# Patient Record
Sex: Female | Born: 1970 | Race: Black or African American | Hispanic: No | Marital: Single | Smoking: Never smoker
Health system: Southern US, Community
[De-identification: ages and names within clinical notes are randomized; demographics above are authoritative.]

## PROBLEM LIST (undated history)

## (undated) DIAGNOSIS — I10 Essential (primary) hypertension: Secondary | ICD-10-CM

## (undated) HISTORY — PX: ABDOMINAL HYSTERECTOMY: SHX81

---

## 2006-09-15 ENCOUNTER — Emergency Department: Payer: Self-pay | Admitting: Emergency Medicine

## 2008-06-22 ENCOUNTER — Emergency Department: Payer: Self-pay | Admitting: Emergency Medicine

## 2009-10-18 ENCOUNTER — Ambulatory Visit: Payer: Self-pay | Admitting: Obstetrics & Gynecology

## 2009-10-22 ENCOUNTER — Ambulatory Visit: Payer: Self-pay | Admitting: Obstetrics & Gynecology

## 2010-05-23 ENCOUNTER — Emergency Department: Payer: Self-pay | Admitting: Emergency Medicine

## 2012-09-11 ENCOUNTER — Emergency Department: Payer: Self-pay | Admitting: Emergency Medicine

## 2013-10-27 ENCOUNTER — Emergency Department: Payer: Self-pay | Admitting: Emergency Medicine

## 2013-11-14 ENCOUNTER — Emergency Department: Payer: Self-pay | Admitting: Emergency Medicine

## 2014-01-09 ENCOUNTER — Emergency Department: Payer: Self-pay | Admitting: Emergency Medicine

## 2015-01-12 ENCOUNTER — Emergency Department
Admit: 2015-01-12 | Disposition: A | Discharge status: Home / Self Care | Payer: Self-pay | Admitting: Emergency Medicine

## 2015-04-13 ENCOUNTER — Other Ambulatory Visit: Payer: Self-pay | Admitting: Internal Medicine

## 2015-04-13 DIAGNOSIS — Z1231 Encounter for screening mammogram for malignant neoplasm of breast: Secondary | ICD-10-CM

## 2015-04-19 ENCOUNTER — Ambulatory Visit
Admission: RE | Admit: 2015-04-19 | Discharge: 2015-04-19 | Disposition: A | Discharge status: Home / Self Care | Payer: PRIVATE HEALTH INSURANCE | Source: Ambulatory Visit | Attending: Internal Medicine | Admitting: Internal Medicine

## 2015-04-19 DIAGNOSIS — Z1231 Encounter for screening mammogram for malignant neoplasm of breast: Secondary | ICD-10-CM | POA: Insufficient documentation

## 2015-04-19 DIAGNOSIS — R928 Other abnormal and inconclusive findings on diagnostic imaging of breast: Secondary | ICD-10-CM | POA: Insufficient documentation

## 2015-04-20 ENCOUNTER — Other Ambulatory Visit: Payer: Self-pay

## 2015-04-23 ENCOUNTER — Other Ambulatory Visit: Payer: Self-pay | Admitting: Internal Medicine

## 2015-04-23 ENCOUNTER — Ambulatory Visit: Admit: 2015-04-23 | Payer: Self-pay

## 2015-04-23 DIAGNOSIS — N6489 Other specified disorders of breast: Secondary | ICD-10-CM

## 2015-04-23 DIAGNOSIS — R928 Other abnormal and inconclusive findings on diagnostic imaging of breast: Secondary | ICD-10-CM

## 2015-04-23 SURGERY — CORRECTION, HALLUX VALGUS
Anesthesia: Choice | Laterality: Right

## 2015-04-28 ENCOUNTER — Ambulatory Visit
Admission: RE | Admit: 2015-04-28 | Discharge: 2015-04-28 | Disposition: A | Discharge status: Home / Self Care | Payer: PRIVATE HEALTH INSURANCE | Source: Ambulatory Visit | Attending: Internal Medicine | Admitting: Internal Medicine

## 2015-04-28 DIAGNOSIS — N6489 Other specified disorders of breast: Secondary | ICD-10-CM

## 2015-04-28 DIAGNOSIS — R928 Other abnormal and inconclusive findings on diagnostic imaging of breast: Secondary | ICD-10-CM | POA: Insufficient documentation

## 2015-05-04 ENCOUNTER — Other Ambulatory Visit: Payer: Self-pay | Admitting: Internal Medicine

## 2015-05-04 DIAGNOSIS — N63 Unspecified lump in unspecified breast: Secondary | ICD-10-CM

## 2015-05-04 DIAGNOSIS — N6489 Other specified disorders of breast: Secondary | ICD-10-CM

## 2015-05-10 ENCOUNTER — Ambulatory Visit
Admission: RE | Admit: 2015-05-10 | Discharge: 2015-05-10 | Disposition: A | Discharge status: Home / Self Care | Payer: PRIVATE HEALTH INSURANCE | Source: Ambulatory Visit | Attending: Internal Medicine | Admitting: Internal Medicine

## 2015-05-10 DIAGNOSIS — N63 Unspecified lump in unspecified breast: Secondary | ICD-10-CM

## 2015-05-10 DIAGNOSIS — N6489 Other specified disorders of breast: Secondary | ICD-10-CM

## 2015-05-10 DIAGNOSIS — D249 Benign neoplasm of unspecified breast: Secondary | ICD-10-CM | POA: Insufficient documentation

## 2015-05-11 HISTORY — PX: BREAST BIOPSY: SHX20

## 2015-05-11 LAB — SURGICAL PATHOLOGY

## 2015-06-07 ENCOUNTER — Encounter: Payer: Self-pay | Admitting: *Deleted

## 2015-06-07 ENCOUNTER — Inpatient Hospital Stay: Admission: RE | Admit: 2015-06-07 | Payer: PRIVATE HEALTH INSURANCE | Source: Ambulatory Visit

## 2015-06-07 NOTE — Patient Instructions (Signed)
  Your procedure is scheduled on: 06-11-15 Report to [place] To find out your arrival time please call [phone] between 1PM - 3PM on 06-10-15  Remember: Instructions that are not followed completely may result in serious medical risk, up to and including death, or upon the discretion of your surgeon and anesthesiologist your surgery may need to be rescheduled.    _X___ 1. Do not eat food or drink liquids after midnight. No gum chewing or hard candies.     _X___ 2. No Alcohol for 24 hours before or after surgery.   ____ 3. Bring all medications with you on the day of surgery if instructed.    ____ 4. Notify your doctor if there is any change in your medical condition     (cold, fever, infections).     Do not wear jewelry, make-up, hairpins, clips or nail polish.  Do not wear lotions, powders, or perfumes. You may wear deodorant.  Do not shave 48 hours prior to surgery. Men may shave face and neck.  Do not bring valuables to the hospital.    [facility] is not responsible for any belongings or valuables.               Contacts, dentures or bridgework may not be worn into surgery.  Leave your suitcase in the car. After surgery it may be brought to your room.  For patients admitted to the hospital, discharge time is determined by your  treatment team.   Patients discharged the day of surgery will not be allowed to drive home.   Please read over the following fact sheets that you were given:      _X___ Take these medicines the morning of surgery with A SIP OF WATER:    1. LISINOPRIL  2.   3.   4.  5.  6.  ____ Fleet Enema (as directed)   ____ Use CHG Soap as directed  ____ Use inhalers on the day of surgery  ____ Stop metformin 2 days prior to surgery    ____ Take 1/2 of usual insulin dose the night before surgery and none on the morning of surgery.   ____ Stop Coumadin/Plavix/aspirin-N/A  ____ Stop Anti-inflammatories-NO NSAIDS OR ASA  PRODUCTS-TYLENOL OK   ____ Stop supplements until after surgery.    ____ Bring C-Pap to the hospital.

## 2015-06-11 ENCOUNTER — Encounter
Admission: RE | Disposition: A | Discharge status: Home / Self Care | Payer: Self-pay | Source: Ambulatory Visit | Attending: Podiatry

## 2015-06-11 ENCOUNTER — Ambulatory Visit: Payer: PRIVATE HEALTH INSURANCE | Admitting: Anesthesiology

## 2015-06-11 ENCOUNTER — Ambulatory Visit
Admission: RE | Admit: 2015-06-11 | Discharge: 2015-06-11 | Disposition: A | Discharge status: Home / Self Care | Payer: PRIVATE HEALTH INSURANCE | Source: Ambulatory Visit | Attending: Podiatry | Admitting: Podiatry

## 2015-06-11 DIAGNOSIS — M2011 Hallux valgus (acquired), right foot: Secondary | ICD-10-CM | POA: Diagnosis present

## 2015-06-11 DIAGNOSIS — Z90710 Acquired absence of both cervix and uterus: Secondary | ICD-10-CM | POA: Insufficient documentation

## 2015-06-11 DIAGNOSIS — G8929 Other chronic pain: Secondary | ICD-10-CM | POA: Insufficient documentation

## 2015-06-11 DIAGNOSIS — I10 Essential (primary) hypertension: Secondary | ICD-10-CM | POA: Insufficient documentation

## 2015-06-11 DIAGNOSIS — M79671 Pain in right foot: Secondary | ICD-10-CM | POA: Insufficient documentation

## 2015-06-11 DIAGNOSIS — Z79899 Other long term (current) drug therapy: Secondary | ICD-10-CM | POA: Diagnosis not present

## 2015-06-11 HISTORY — PX: HALLUX VALGUS AUSTIN: SHX6623

## 2015-06-11 HISTORY — DX: Essential (primary) hypertension: I10

## 2015-06-11 LAB — CBC
HEMATOCRIT: 38.6 % (ref 35.0–47.0)
Hemoglobin: 12.9 g/dL (ref 12.0–16.0)
MCH: 31.2 pg (ref 26.0–34.0)
MCHC: 33.3 g/dL (ref 32.0–36.0)
MCV: 93.6 fL (ref 80.0–100.0)
Platelets: 226 10*3/uL (ref 150–440)
RBC: 4.12 MIL/uL (ref 3.80–5.20)
RDW: 12.7 % (ref 11.5–14.5)
WBC: 5.9 10*3/uL (ref 3.6–11.0)

## 2015-06-11 SURGERY — CORRECTION, HALLUX VALGUS
Anesthesia: General | Laterality: Right

## 2015-06-11 MED ORDER — PROPOFOL INFUSION 10 MG/ML OPTIME
INTRAVENOUS | Status: DC | PRN
  Administered 2015-06-11: 30 ug/kg/min via INTRAVENOUS

## 2015-06-11 MED ORDER — LACTATED RINGERS IV SOLN
INTRAVENOUS | Status: DC
  Administered 2015-06-11 (×2): via INTRAVENOUS

## 2015-06-11 MED ORDER — HYDROCODONE-ACETAMINOPHEN 5-325 MG PO TABS: 1.0000 | ORAL_TABLET | ORAL | Status: DC | PRN

## 2015-06-11 MED ORDER — FAMOTIDINE 20 MG PO TABS
20.0000 mg | ORAL_TABLET | Freq: Once | ORAL | Status: AC
  Administered 2015-06-11: 20 mg via ORAL

## 2015-06-11 MED ORDER — FENTANYL CITRATE (PF) 100 MCG/2ML IJ SOLN: 25.0000 ug | INTRAMUSCULAR | Status: DC | PRN

## 2015-06-11 MED ORDER — FENTANYL CITRATE (PF) 100 MCG/2ML IJ SOLN
INTRAMUSCULAR | Status: DC | PRN
  Administered 2015-06-11: 50 ug via INTRAVENOUS
  Administered 2015-06-11 (×2): 25 ug via INTRAVENOUS

## 2015-06-11 MED ORDER — BUPIVACAINE HCL (PF) 0.5 % IJ SOLN
INTRAMUSCULAR | Status: DC | PRN
  Administered 2015-06-11: 10 mL

## 2015-06-11 MED ORDER — LACTATED RINGERS IV SOLN: INTRAVENOUS | Status: DC

## 2015-06-11 MED ORDER — LIDOCAINE HCL (PF) 1 % IJ SOLN
INTRAMUSCULAR | Status: AC
  Filled 2015-06-11: qty 30

## 2015-06-11 MED ORDER — BUPIVACAINE HCL (PF) 0.5 % IJ SOLN
INTRAMUSCULAR | Status: AC
  Filled 2015-06-11: qty 30

## 2015-06-11 MED ORDER — FAMOTIDINE 20 MG PO TABS
ORAL_TABLET | ORAL | Status: AC
  Administered 2015-06-11: 20 mg via ORAL
  Filled 2015-06-11: qty 1

## 2015-06-11 MED ORDER — MIDAZOLAM HCL 2 MG/2ML IJ SOLN
INTRAMUSCULAR | Status: DC | PRN
  Administered 2015-06-11: 2 mg via INTRAVENOUS

## 2015-06-11 MED ORDER — ONDANSETRON HCL 4 MG/2ML IJ SOLN: 4.0000 mg | Freq: Once | INTRAMUSCULAR | Status: DC | PRN

## 2015-06-11 MED ORDER — CEFAZOLIN SODIUM 1-5 GM-% IV SOLN
INTRAVENOUS | Status: AC
  Filled 2015-06-11: qty 50

## 2015-06-11 MED ORDER — CEFAZOLIN SODIUM 1-5 GM-% IV SOLN
1.0000 g | Freq: Once | INTRAVENOUS | Status: AC
  Administered 2015-06-11: 1 g via INTRAVENOUS

## 2015-06-11 MED ORDER — NEOMYCIN-POLYMYXIN B GU 40-200000 IR SOLN
Status: DC | PRN
  Administered 2015-06-11: 2 mL

## 2015-06-11 MED ORDER — NEOMYCIN-POLYMYXIN B GU 40-200000 IR SOLN
Status: AC
  Filled 2015-06-11: qty 2

## 2015-06-11 SURGICAL SUPPLY — 61 items
BAG COUNTER SPONGE EZ (MISCELLANEOUS) IMPLANT
BANDAGE ELASTIC 4 CLIP NS LF (GAUZE/BANDAGES/DRESSINGS) ×3 IMPLANT
BANDAGE STRETCH 3X4.1 STRL (GAUZE/BANDAGES/DRESSINGS) IMPLANT
BIT DRILL CANN 3.0 (BIT) ×3 IMPLANT
BIT DRILL TWST CANN 2.2X1.87MM (DRILL) ×1 IMPLANT
BIT DRILL TWST COUNTRSNK 122IN (MISCELLANEOUS) ×1 IMPLANT
BLADE EAR TYMPAN 2.5 STR BEAV (BLADE) ×3 IMPLANT
BLADE MED AGGRESSIVE (BLADE) ×3 IMPLANT
BLADE OSC/SAGITTAL MD 5.5X18 (BLADE) IMPLANT
BLADE SURG 15 STRL LF DISP TIS (BLADE) ×2 IMPLANT
BLADE SURG 15 STRL SS (BLADE) ×4
BNDG ESMARK 4X12 TAN STRL LF (GAUZE/BANDAGES/DRESSINGS) ×3 IMPLANT
CANISTER SUCT 1200ML W/VALVE (MISCELLANEOUS) ×3 IMPLANT
CLOSURE WOUND 1/4X4 (GAUZE/BANDAGES/DRESSINGS) ×1
COUNTER SPONGE BAG EZ (MISCELLANEOUS)
COUNTERSINK IMPLANT
CUFF TOURN 18 STER (MISCELLANEOUS) IMPLANT
CUFF TOURN DUAL PL 12 NO SLV (MISCELLANEOUS) ×3 IMPLANT
DRAPE FLUOR MINI C-ARM 54X84 (DRAPES) ×3 IMPLANT
DRILL BIT IMPLANT
DRILL TWIST CANN 2.2X1.87MM (DRILL) ×3
DRILL TWIST COUNTERSINK 122IN (MISCELLANEOUS) ×3
DRSG TELFA 3X8 NADH (GAUZE/BANDAGES/DRESSINGS) IMPLANT
DURAPREP 26ML APPLICATOR (WOUND CARE) ×3 IMPLANT
GAUZE PETRO XEROFOAM 1X8 (MISCELLANEOUS) ×3 IMPLANT
GAUZE SPONGE 4X4 12PLY STRL (GAUZE/BANDAGES/DRESSINGS) ×3 IMPLANT
GAUZE STRETCH 2X75IN STRL (MISCELLANEOUS) ×3 IMPLANT
GLOVE BIO SURGEON STRL SZ7.5 (GLOVE) ×9 IMPLANT
GLOVE INDICATOR 8.0 STRL GRN (GLOVE) ×3 IMPLANT
GOWN STRL REUS W/ TWL LRG LVL3 (GOWN DISPOSABLE) ×2 IMPLANT
GOWN STRL REUS W/TWL LRG LVL3 (GOWN DISPOSABLE) ×4
K-WIRE IMPLANT
K-WIRE 1.1 (WIRE) ×2
K-WIRE FX100X1.1XTROC TIP (WIRE) ×1
K-WIRE TROCAR .8X100 (WIRE) ×6 IMPLANT
KWIRE FX100X1.1XTROC TIP (WIRE) ×1 IMPLANT
LABEL OR SOLS (LABEL) ×3 IMPLANT
NEEDLE FILTER BLUNT 18X 1/2SAF (NEEDLE) ×4
NEEDLE FILTER BLUNT 18X1 1/2 (NEEDLE) ×2 IMPLANT
NEEDLE HYPO 25X1 1.5 SAFETY (NEEDLE) ×3 IMPLANT
NS IRRIG 500ML POUR BTL (IV SOLUTION) ×3 IMPLANT
PACK EXTREMITY ARMC (MISCELLANEOUS) ×3 IMPLANT
PAD CAST CTTN 4X4 STRL (SOFTGOODS) ×3 IMPLANT
PAD GROUND ADULT SPLIT (MISCELLANEOUS) ×3 IMPLANT
PADDING CAST COTTON 4X4 STRL (SOFTGOODS) ×6
PENCIL ELECTRO HAND CTR (MISCELLANEOUS) ×3 IMPLANT
RASP SM TEAR CROSS CUT (RASP) ×3 IMPLANT
SCREW CANN 2.2X23 SH THR (Screw) ×3 IMPLANT
SOL PREP PVP 2OZ (MISCELLANEOUS) ×3
SOLUTION PREP PVP 2OZ (MISCELLANEOUS) ×1 IMPLANT
SPLINT FAST PLASTER 5X30 (CAST SUPPLIES) ×2
SPLINT PLASTER CAST FAST 5X30 (CAST SUPPLIES) ×1 IMPLANT
STOCKINETTE STRL 6IN 960660 (GAUZE/BANDAGES/DRESSINGS) ×3 IMPLANT
STRAP SAFETY BODY (MISCELLANEOUS) ×3 IMPLANT
STRIP CLOSURE SKIN 1/4X4 (GAUZE/BANDAGES/DRESSINGS) ×2 IMPLANT
SUT VIC AB 4-0 FS2 27 (SUTURE) ×3 IMPLANT
SWABSTK COMLB BENZOIN TINCTURE (MISCELLANEOUS) ×3 IMPLANT
SYR 5ML LL (SYRINGE) ×3 IMPLANT
SYRINGE 10CC LL (SYRINGE) IMPLANT
WIRE Z .045 C-WIRE SPADE TIP (WIRE) ×3 IMPLANT
WIRE Z .062 C-WIRE SPADE TIP (WIRE) IMPLANT

## 2015-06-11 NOTE — Transfer of Care (Signed)
Immediate Anesthesia Transfer of Care Note  Patient: Amy Bullock  Procedure(s) Performed: Procedure(s): HALLUX VALGUS [place] (Right)  Patient Location: PACU  Anesthesia Type:General  Level of Consciousness: awake, alert  and oriented  Airway & Oxygen Therapy: Patient Spontanous Breathing and Patient connected to nasal cannula oxygen  Post-op Assessment: Report given to RN and Post -op Vital signs reviewed and stable  Post vital signs: Reviewed and stable  Last Vitals:  Filed Vitals:   06/11/15 0608  BP: 165/121  Pulse: 70  Temp: 36.6 C  Resp: 16    Complications: No apparent anesthesia complications

## 2015-06-11 NOTE — Op Note (Signed)
Date of operation: 09/16 2016.  Surgeon: Durward Fortes DPM.  Preoperative diagnosis: Hallux valgus deformity right foot.  Postoperative diagnosis: Same.  Procedure: Hallux valgus correction right foot [place] osteotomy.  Anesthesia: Local Mac.  Hemostasis: Pneumatic tourniquet right ankle 250 mmHg.  Estimated blood loss: 5 cc.  Pathology: None.  Materials: 2.2 mm x 23 mm length Medartis screw.  Complications: None apparent.  Operative indications: This is a 44 year old female with a history of a painful bunion/hallux valgus deformity on her right foot. She has been through conservative care and elects for surgical correction of her deformity.  Operative procedure: The patient was taken to the operating room and placed on the table in the supine position. Following satisfactory sedation the right foot was anesthetized with 10 cc of 0.5% bupivacaine plain around the right first metatarsal. A pneumatic tourniquet was applied at the level of the right ankle and the foot was prepped and draped in the usual sterile fashion. The foot was exsanguinated and the tourniquet inflated to 250 mmHg.   Attention was then directed to the dorsal aspect of the right foot where an approximate 5 cm linear incision was made coursing proximal to distal centered over the first metatarsal and metatarsophalangeal joint. The incision was deepened via sharp and blunt dissection with care taken to coagulate all bleeders and retract neurovascular structures. At the level of the joint a linear capsulotomy was performed and the capsular and periosteal tissues reflected off the medial and dorsal head of the first metatarsal. There was a prominent medial eminence with some degenerative changes in the sagittal groove. Using a bone saw the medial eminence was was resected in toto. A 0.045 inch K wire was then driven from medial to lateral as an axis guide and a V-shaped osteotomy was performed through the head of the first  metatarsal. The pin was removed and the capital fragment was freely mobilized.   Attention was then directed to the lateral aspect of the joint where the incision was deepened down into the first intermetatarsal space. The intermetatarsal ligament was incised and the abductor tendon was freed from the lateral aspect of the sesamoids. A fibular sesamoid release was also performed. There was noted to be good release of the contracture on the great toe. Attention was then directed back medially where the capital fragment was mobilized laterally and fixated using a pin from the Medartis screw set. Intraoperative FluoroScan views revealed good reduction of the deformity. A 2.2 mm Medartis screw, 23 mm length was then inserted over the wire in standard fashion and there was excellent compression at the osteotomy right. The pin was removed. The medial eminence was resected and the edges were rasped smooth. Intraoperative FluoroScan views revealed good alignment of the first ray with screw placement. The wound was then flushed with copious amounts of sterile saline and closed in layers using 40 vital running suture for all layers from capsular and periosteal tissue to deep and super superficial subcutaneous and followed by skin closure. Tincture of benzoin and Steri-Strips applied to the incision followed by Xeroform and a sterile bandage. The tourniquet was released and blood flow noted to return immediately to the right foot and all digits. A posterior plaster splint was then applied to the right foot and ankle with the foot 90 relative to the leg. Patient tolerated the procedure and anesthesia well and was transported to the PACU with vital signs stable and in good condition.

## 2015-06-11 NOTE — H&P (Signed)
  History and physical in the chart was reviewed. No significant changes. Patient stable for surgery.

## 2015-06-11 NOTE — Anesthesia Preprocedure Evaluation (Signed)
Anesthesia Evaluation  Patient identified by MRN, date of birth, ID band Patient awake    Reviewed: Allergy & Precautions, NPO status , Patient's Chart, lab work & pertinent test results  History of Anesthesia Complications Negative for: history of anesthetic complications  Airway Mallampati: II  TM Distance: >3 FB Neck ROM: Full    Dental  (+) Teeth Intact   Pulmonary neg pulmonary ROS,           Cardiovascular hypertension, Pt. on medications      Neuro/Psych    GI/Hepatic negative GI ROS, Neg liver ROS,   Endo/Other  negative endocrine ROS  Renal/GU negative Renal ROS     Musculoskeletal   Abdominal   Peds  Hematology negative hematology ROS (+)   Anesthesia Other Findings   Reproductive/Obstetrics                             Anesthesia Physical Anesthesia Plan  ASA: II  Anesthesia Plan: General   Post-op Pain Management:    Induction: Intravenous  Airway Management Planned: Nasal Cannula  Additional Equipment:   Intra-op Plan:   Post-operative Plan:   Informed Consent: I have reviewed the patients History and Physical, chart, labs and discussed the procedure including the risks, benefits and alternatives for the proposed anesthesia with the patient or authorized representative who has indicated his/her understanding and acceptance.     Plan Discussed with:   Anesthesia Plan Comments:         Anesthesia Quick Evaluation

## 2015-06-11 NOTE — Interval H&P Note (Signed)
History and Physical Interval Note:  06/11/2015 7:13 AM  Amy Bullock  has presented today for surgery, with the diagnosis of RIGHT HALLUX VALGUS  The various methods of treatment have been discussed with the patient and family. After consideration of risks, benefits and other options for treatment, the patient has consented to  Procedure(s): [place] (Right) as a surgical intervention .  The patient's history has been reviewed, patient examined, no change in status, stable for surgery.  I have reviewed the patient's chart and labs.  Questions were answered to the patient's satisfaction.     [name]

## 2015-06-11 NOTE — Discharge Instructions (Addendum)
1. Elevate the right lower extremity.  2. Keep the bandage clean, dry, and do not remove.  3. Sponge bathe only the right lower leg.  4. Wear surgical shoes whenever walking or standing on the right foot.  5. Take one pain pill, Norco, every 4 hours as needed for painAMBULATORY SURGERY  DISCHARGE INSTRUCTIONS   1) The drugs that you were given will stay in your system until tomorrow so for the next 24 hours you should not:  A) Drive an automobile B) Make any legal decisions C) Drink any alcoholic beverage   2) You may resume regular meals tomorrow.  Today it is better to start with liquids and gradually work up to solid foods.  You may eat anything you prefer, but it is better to start with liquids, then soup and crackers, and gradually work up to solid foods.   3) Please notify your doctor immediately if you have any unusual bleeding, trouble breathing, redness and pain at the surgery site, drainage, fever, or pain not relieved by medication.    4) Additional Instructions:        Please contact your physician with any problems or Same Day Surgery at [phone], Monday through Friday 6 am to 4 pm, or [place] at [facility] number at [phone].

## 2015-06-11 NOTE — Anesthesia Postprocedure Evaluation (Signed)
  Anesthesia Post-op Note  Patient: Amy Bullock  Procedure(s) Performed: Procedure(s): HALLUX VALGUS AUSTIN (Right)  Anesthesia type:General  Patient location: PACU  Post pain: Pain level controlled  Post assessment: Post-op Vital signs reviewed, Patient's Cardiovascular Status Stable, Respiratory Function Stable, Patent Airway and No signs of Nausea or vomiting  Post vital signs: Reviewed and stable  Last Vitals:  Filed Vitals:   06/11/15 1011  BP: 184/97  Pulse: 8  Temp:   Resp:     Level of consciousness: awake, alert  and patient cooperative  Complications: No apparent anesthesia complications

## 2015-11-11 ENCOUNTER — Other Ambulatory Visit: Payer: Self-pay | Admitting: Internal Medicine

## 2015-12-02 ENCOUNTER — Other Ambulatory Visit: Payer: Self-pay | Admitting: Internal Medicine

## 2015-12-02 DIAGNOSIS — R928 Other abnormal and inconclusive findings on diagnostic imaging of breast: Secondary | ICD-10-CM

## 2015-12-30 ENCOUNTER — Ambulatory Visit
Admission: RE | Admit: 2015-12-30 | Discharge: 2015-12-30 | Disposition: A | Discharge status: Home / Self Care | Payer: PRIVATE HEALTH INSURANCE | Source: Ambulatory Visit | Attending: Internal Medicine | Admitting: Internal Medicine

## 2015-12-30 DIAGNOSIS — R928 Other abnormal and inconclusive findings on diagnostic imaging of breast: Secondary | ICD-10-CM | POA: Insufficient documentation

## 2016-07-26 ENCOUNTER — Other Ambulatory Visit: Payer: Self-pay | Admitting: Internal Medicine

## 2017-04-23 IMAGING — US US BREAST LTD UNI LEFT INC AXILLA
1 series · 1 of 1 positions shown · non-contrast
Comparison: Baseline screening mammogram dated 04/19/2015

CLINICAL DATA: 44-year-old female, callback from screening
mammogram for possible left breast distortion.

EXAM:
DIGITAL DIAGNOSTIC LEFT MAMMOGRAM WITH 3D TOMOSYNTHESIS WITH CAD
ULTRASOUND LEFT BREAST

[Series 1: us breast ltd uni left inc axilla · 0.08mm/px · 1 of 1 slices shown]
[im 1/1]
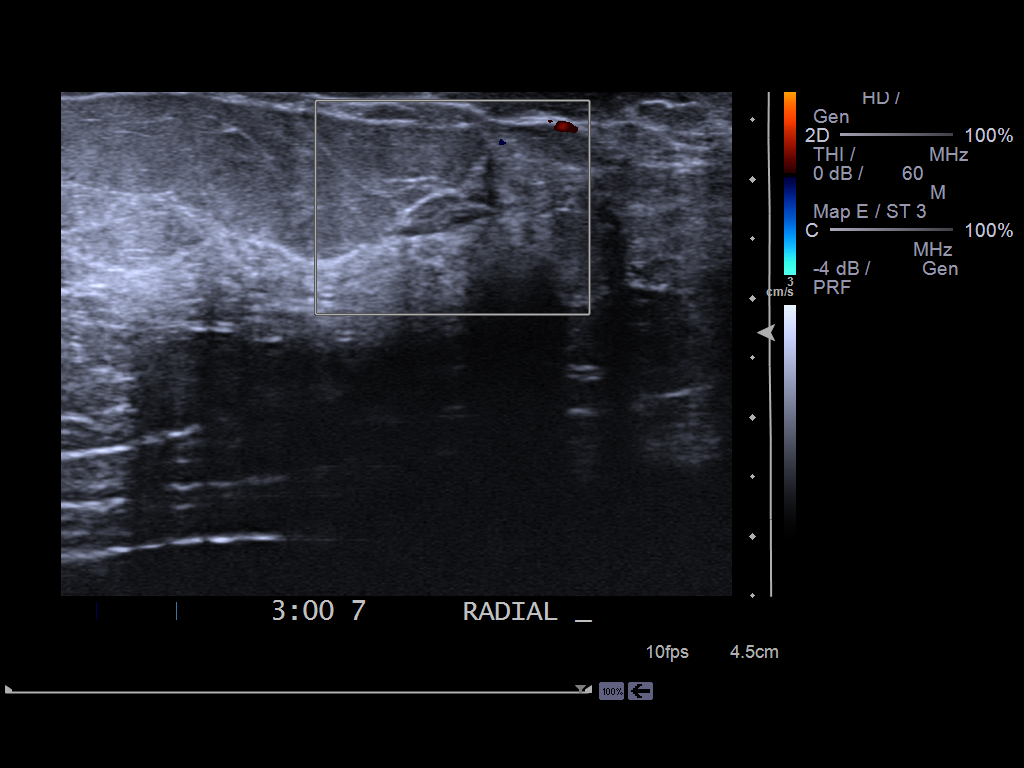

[1 of 1 positions shown; findings below may reference images not displayed]

ACR Breast Density Category c: The breast tissue is heterogeneously
dense, which may obscure small masses.
FINDINGS: CC, MLO, and CC spot compression views of the left breast with
tomosynthesis were performed. On the additional views, the possible
area of distortion identified within the superior left breast does
not persist. A possible asymmetry was seen within the lateral left
breast on the CC tomosynthesis images. This finding appeared to be
within the inferior left breast on the tomosynthesis images. No
correlate was identified on the MLO view.

Mammographic images were processed with CAD.

On physical exam, no discrete mass is felt in the area of concern
within the lower, outer left breast.

Targeted ultrasound of the lower, outer left breast was performed
demonstrating an oval, hypoechoic mass at 3 o'clock, 7 cm from the
nipple measuring 10 x 4 x 8 mm. This finding is thought likely
benign and may represent area of fibrocystic change versus a
fibroadenoma. This finding is thought likely incidental. No definite
correlate for the possible asymmetry identified mammographically is
identified.
IMPRESSION: Probably benign left breast findings.

RECOMMENDATION:
Options including short-term follow-up versus ultrasound-guided
biopsy were discussed with the patient. The patient wishes to pursue
ultrasound-guided biopsy. As this finding is thought likely
incidental and unrelated to the possible asymmetry identified within
the lateral left breast, a six-month follow-up diagnostic mammogram
and possible ultrasound is recommended if biopsy results are benign.

I have discussed the findings and recommendations with the patient.
Results were also provided in writing at the conclusion of the
visit. If applicable, a reminder letter will be sent to the patient
regarding the next appointment.

BI-RADS CATEGORY  3: Probably benign.

## 2017-05-05 IMAGING — US US BREAST BX W LOC DEV 1ST LESION IMG BX SPEC US GUIDE*L*
1 series · 8 of 8 positions shown · non-contrast
Comparison: Previous exam(s).

ADDENDUM:
Pathology of the left breast biopsy revealed FIBROADENOMA. NEGATIVE
FOR ATYPIA AND MALIGNANCY. This was found to be concordant by Dr.
Naveen Shabazz.

At the patient's request, pathology and recommendations were relayed
to the patient by phone. She stated she has done well following the
biopsy with no bleeding, bruising, or hematoma at the biopsy site.
Post biopsy instructions were reviewed with the patient. She was
encouraged to call the [REDACTED]
Recommendations: Recommend a six month follow-up mammogram and
possible ultrasound for additional asymmetry of the left breast.
Pathology and recommendations relayed by Odowd, Aravind on
05/11/15.
CLINICAL DATA: Left breast mass 3 o'clock position.
EXAM:
ULTRASOUND GUIDED LEFT BREAST CORE NEEDLE BIOPSY

[Series 1: us breast bx w loc dev 1st lesion img bx spec us g · 0.08mm/px · 8 of 8 slices shown]
[im 1/8]
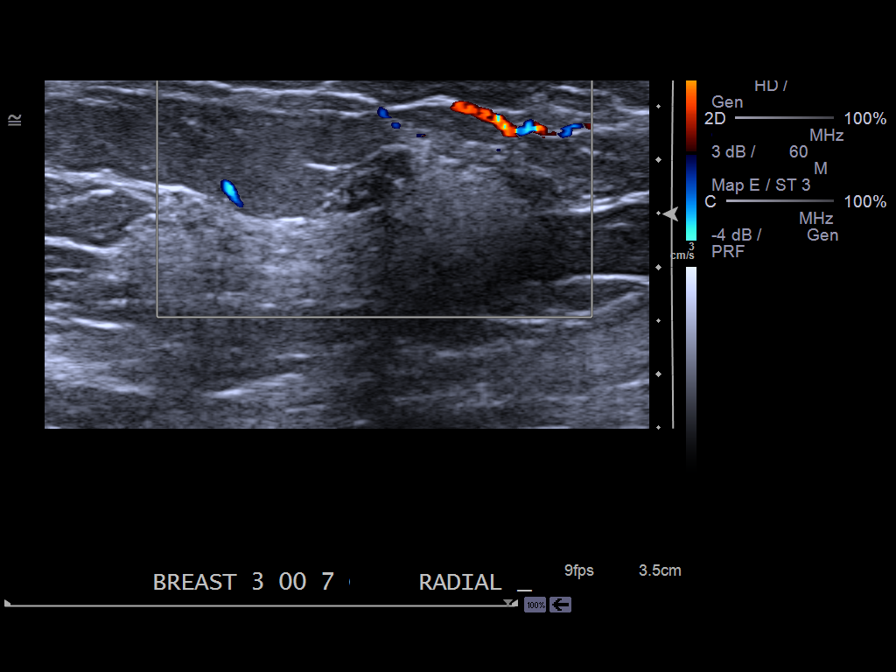
[im 2/8]
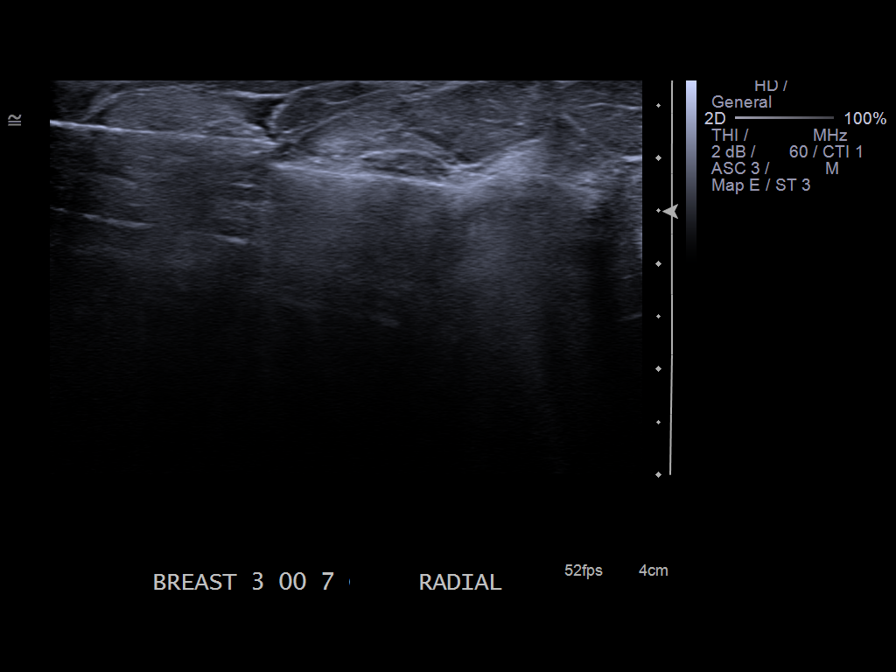
[im 3/8]
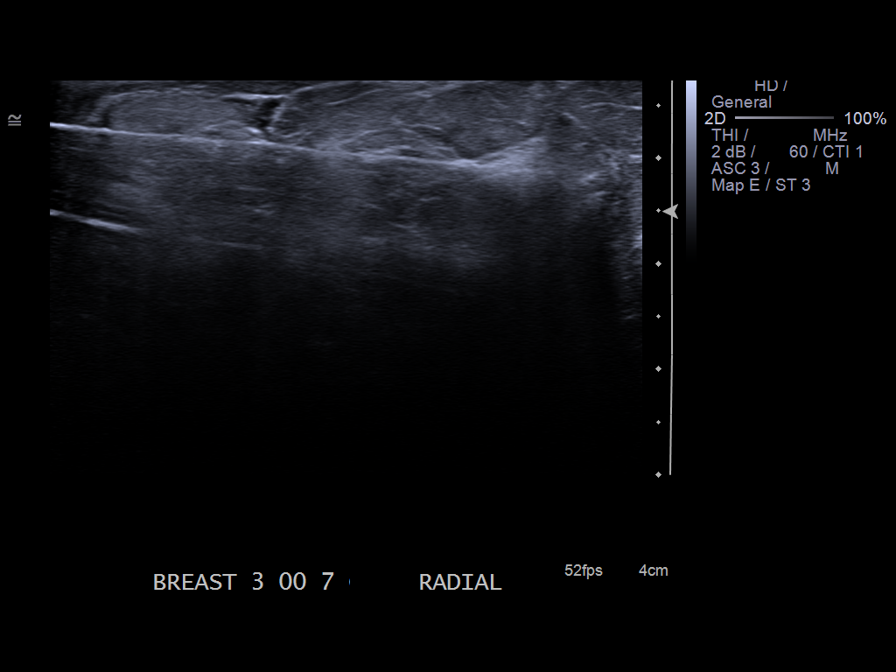
[im 4/8]
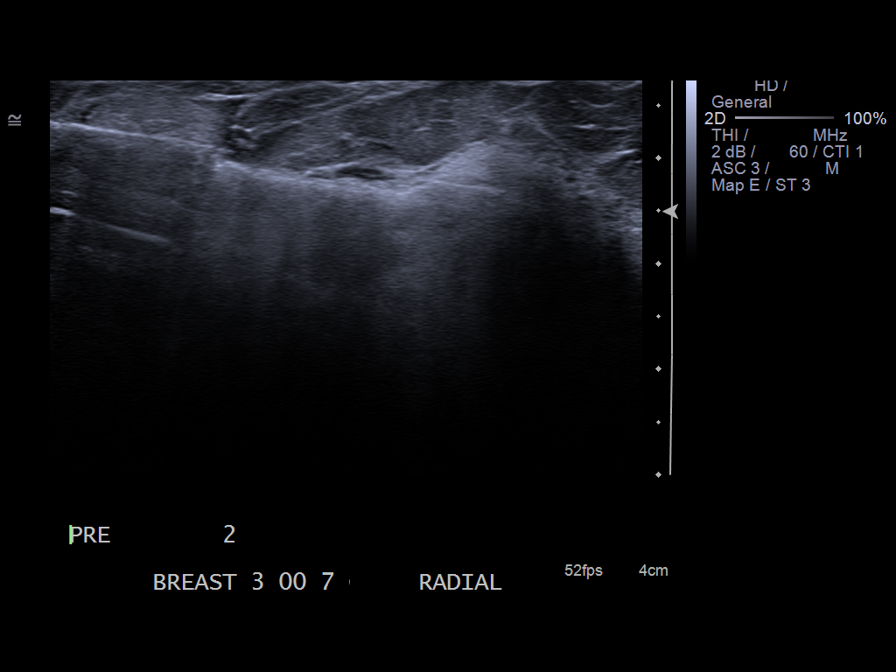
[im 5/8]
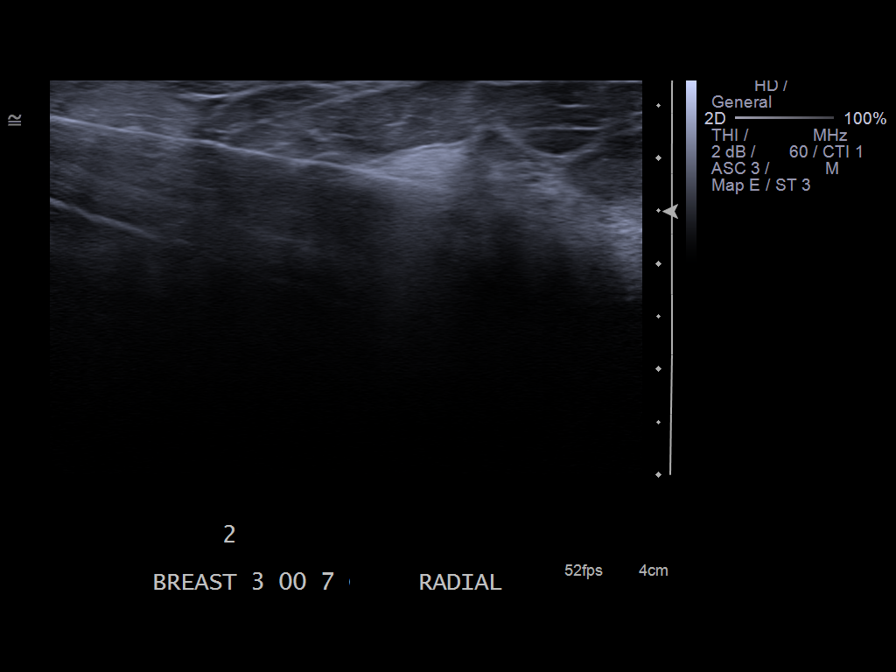
[im 6/8]
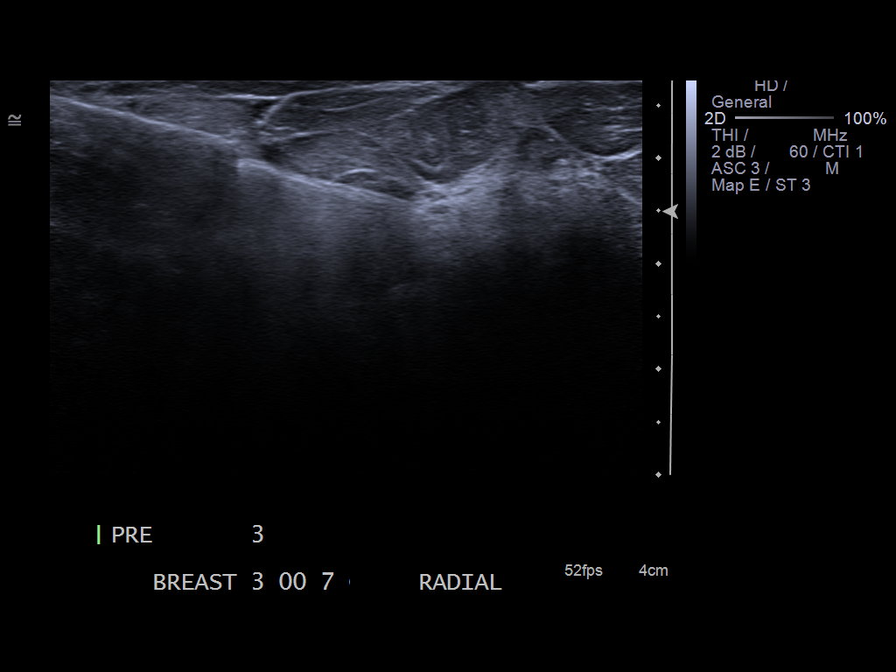
[im 7/8]
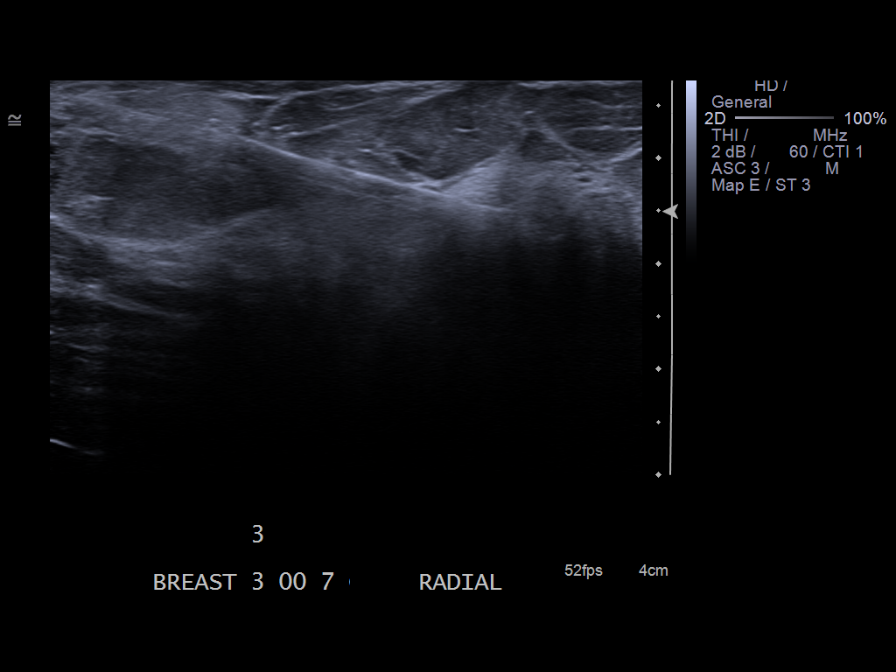
[im 8/8]
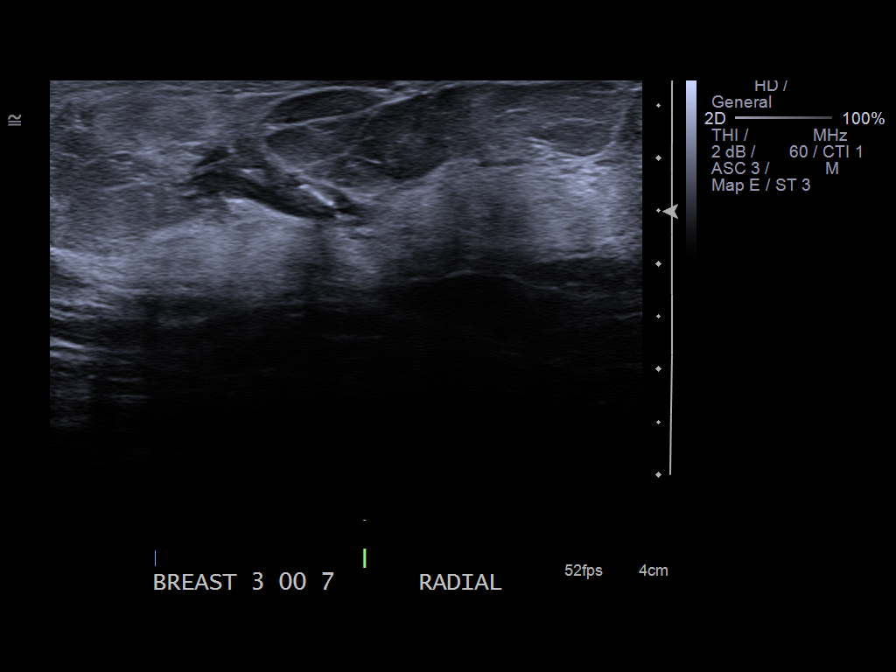

[8 of 8 positions shown; findings below may reference images not displayed]

PROCEDURE:
I met with the patient and we discussed the procedure of
ultrasound-guided biopsy, including benefits and alternatives. We
discussed the high likelihood of a successful procedure. We
discussed the risks of the procedure including infection, bleeding,
tissue injury, clip migration, and inadequate sampling. Informed
written consent was given. The usual time-out protocol was performed
immediately prior to the procedure.

Using sterile technique and 2% Lidocaine as local anesthetic, under
direct ultrasound visualization, a 12 gauge vacuum-assisted device
was used to perform biopsy of left breast mass 3 o'clock
positionusing a lateral approach. At the conclusion of the
procedure, a wing shaped tissue marker clip was deployed into the
biopsy cavity. Follow-up 2-view mammogram was performed and dictated
separately.
IMPRESSION: Ultrasound-guided biopsy of left breast mass 3 o'clock position. No
apparent complications.

## 2018-05-21 ENCOUNTER — Emergency Department
Admission: EM | Admit: 2018-05-21 | Discharge: 2018-05-21 | Disposition: A | Discharge status: Home / Self Care | Payer: PRIVATE HEALTH INSURANCE | Attending: Emergency Medicine | Admitting: Emergency Medicine

## 2018-05-21 ENCOUNTER — Encounter: Payer: Self-pay | Admitting: Emergency Medicine

## 2018-05-21 DIAGNOSIS — J0100 Acute maxillary sinusitis, unspecified: Secondary | ICD-10-CM

## 2018-05-21 DIAGNOSIS — Z79899 Other long term (current) drug therapy: Secondary | ICD-10-CM | POA: Insufficient documentation

## 2018-05-21 DIAGNOSIS — R0981 Nasal congestion: Secondary | ICD-10-CM

## 2018-05-21 DIAGNOSIS — I10 Essential (primary) hypertension: Secondary | ICD-10-CM

## 2018-05-21 MED ORDER — FLUTICASONE PROPIONATE 50 MCG/ACT NA SUSP: 2.0000 | Freq: Every day | NASAL | 0 refills | Status: DC

## 2018-05-21 MED ORDER — LORATADINE 10 MG PO TABS
10.0000 mg | ORAL_TABLET | Freq: Once | ORAL | Status: AC
  Administered 2018-05-21: 10 mg via ORAL
  Filled 2018-05-21: qty 1

## 2018-05-21 NOTE — ED Triage Notes (Signed)
Pt with HTN in triage. Pt reports hx of the same and states that her MD took her off of her BP meds about 6 months ago. Encouraged pt to keep check on her BP and follow up.

## 2018-05-21 NOTE — ED Notes (Signed)
First Nurse Note: Patient complaining of HA and sinus pressure that started over the weekend.

## 2018-05-21 NOTE — Discharge Instructions (Addendum)
Your symptoms are consistent with a rhinitis and sinus congestion. Take the prescription nasal spray as directed. Avoid using OTC decongestants (phenylephrine/pseudoephedrine), as these will elevate the blood pressure. Start a daily allergy medicine (Allegra, Claritin, Zyrtec) as well as Benadryl at bedtime. Consider steam facials to reduce sinus pressure. Follow-up with your provider for ongoing symptoms.

## 2018-05-21 NOTE — ED Notes (Signed)
Says sinus pressure since Saturday.  Increases when she bends forward.  No fever.  Says she has been taking something otc.  Not sure what.  Says she was taken off bp meds by her dr because it was doing better, but understands that it was up in triage.

## 2018-05-21 NOTE — ED Triage Notes (Signed)
Pt reports this weekend started with some nasal congestion and a cough. Pt reports now her sinus are hurting and thinks she has a sinus infection.

## 2018-05-22 NOTE — ED Provider Notes (Signed)
[facility] Emergency Department Provider Note ____________________________________________  Time seen: 1015  I have reviewed the triage vital signs and the nursing notes.  HISTORY  Chief Complaint  Facial Pain; Nasal Congestion; and Cough  HPI Amy Bullock is a 47 y.o. female presents to the ED for evaluation of nasal congestion as well as cough.  Patient also reports sinus pressure and is under the impression she may have a sinus infection.  She also notes of elevated blood pressure.  She notes that her primary provider discontinued her blood pressure medicines about 6 months prior.  She does still have the previously prescribed medication at home.  She denies any fevers, chills, or sweats.  She has been taking over-the-counter phenylephrine for sinus pressure relief.  She denies any cough, chest pain, or shortness of breath.  Past Medical History:  Diagnosis Date  . Hypertension     There are no active problems to display for this patient.   Past Surgical History:  Procedure Laterality Date  . ABDOMINAL HYSTERECTOMY    . BREAST BIOPSY Left 05/11/15   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  . HALLUX VALGUS [place] Right 06/11/2015   Procedure: HALLUX VALGUS [place];  Surgeon: Sharlotte Alamo, MD;  Location: [facility] ORS;  Service: Podiatry;  Laterality: Right;    Prior to Admission medications   Medication Sig Start Date End Date Taking? Authorizing Provider  fluticasone (FLONASE) 50 MCG/ACT nasal spray Place 2 sprays into both nostrils daily. 05/21/18   Byanka Landrus, Dannielle Karvonen, PA-C  HYDROcodone-acetaminophen (NORCO) 5-325 MG per tablet Take 1 tablet by mouth every 4 (four) hours as needed for moderate pain. 06/11/15   Sharlotte Alamo, DPM  lisinopril (PRINIVIL,ZESTRIL) 10 MG tablet Take 10 mg by mouth every morning.    [provider]  lisinopril (PRINIVIL,ZESTRIL) 30 MG tablet Take 30 mg by mouth every morning.    [provider]    Allergies Patient has no  known allergies.  No family history on file.  Social History Social History   Tobacco Use  . Smoking status: Never Smoker  Substance Use Topics  . Alcohol use: No  . Drug use: No    Review of Systems  Constitutional: Negative for fever. Eyes: Negative for visual changes. ENT: Negative for sore throat.  Reports sinus pressure and nasal congestion as above. Cardiovascular: Negative for chest pain. Respiratory: Negative for shortness of breath.  Reports nonproductive cough. Gastrointestinal: Negative for abdominal pain, vomiting and diarrhea. Genitourinary: Negative for dysuria. Musculoskeletal: Negative for back pain. Skin: Negative for rash. Neurological: Negative for headaches, focal weakness or numbness. ____________________________________________  PHYSICAL EXAM:  VITAL SIGNS: ED Triage Vitals  Enc Vitals Group     BP 05/21/18 0935 (!) 182/101     Pulse Rate 05/21/18 0934 66     Resp 05/21/18 0934 20     Temp 05/21/18 0934 98 F (36.7 C)     Temp Source 05/21/18 0934 Oral     SpO2 05/21/18 0934 99 %     Weight 05/21/18 0934 160 lb (72.6 kg)     Height 05/21/18 0934 5\' 6"  (1.676 m)     Head Circumference --      Peak Flow --      Pain Score 05/21/18 0934 0     Pain Loc --      Pain Edu? --      Excl. in [place]? --     Constitutional: Alert and oriented. Well appearing and in no distress. Head:  Normocephalic and atraumatic.  Patient with maxillary and frontal sinus tenderness on palpation. Eyes: Conjunctivae are normal. PERRL. Normal extraocular movements Ears: Canals clear. TMs intact bilaterally. Nose: No congestion/rhinorrhea/epistaxis.  Nasal turbinates are pink, moist, and mildly edematous. Mouth/Throat: Mucous membranes are moist.  Uvula is midline and tonsils are flat. Neck: Supple. No thyromegaly. Hematological/Lymphatic/Immunological: No cervical lymphadenopathy. Cardiovascular: Normal rate, regular rhythm. Normal distal pulses. Respiratory: Normal  respiratory effort. No wheezes/rales/rhonchi. ____________________________________________  PROCEDURES  Procedures   loratadine 10 mg PO ____________________________________________  INITIAL IMPRESSION / ASSESSMENT AND PLAN / ED COURSE  Patient with ED evaluation of symptoms consistent with a rhinitis and sinus congestion.  Patient symptoms likely do not represent an acute bacterial sinusitis at this time.  She is discharged with a prescription for Flonase to take as directed.  She is also encouraged to start over-the-counter daily allergy medicine like loratadine, and she may also take Benadryl at night for additional sleep benefit.  She is also encouraged to restart her daily lisinopril for uncontrolled high blood pressure.  She should follow-up with a primary provider or return to the ED as needed. ____________________________________________  FINAL CLINICAL IMPRESSION(S) / ED DIAGNOSES  Final diagnoses:  Acute maxillary sinusitis, recurrence not specified  Nasal congestion  Uncontrolled hypertension      Evangelyn Crouse, Dannielle Karvonen, PA-C 05/22/18 1847    Earleen Newport, MD 05/24/18 0700

## 2020-12-14 ENCOUNTER — Ambulatory Visit (INDEPENDENT_AMBULATORY_CARE_PROVIDER_SITE_OTHER): Payer: 59

## 2020-12-14 ENCOUNTER — Encounter: Payer: Self-pay | Admitting: *Deleted

## 2020-12-14 ENCOUNTER — Ambulatory Visit: Payer: PRIVATE HEALTH INSURANCE | Admitting: Podiatry

## 2020-12-14 ENCOUNTER — Other Ambulatory Visit: Payer: Self-pay

## 2020-12-14 ENCOUNTER — Telehealth: Payer: Self-pay | Admitting: Podiatry

## 2020-12-14 ENCOUNTER — Other Ambulatory Visit: Payer: Self-pay | Admitting: Podiatry

## 2020-12-14 DIAGNOSIS — M2012 Hallux valgus (acquired), left foot: Secondary | ICD-10-CM

## 2020-12-14 DIAGNOSIS — M2010 Hallux valgus (acquired), unspecified foot: Secondary | ICD-10-CM

## 2020-12-14 DIAGNOSIS — M722 Plantar fascial fibromatosis: Secondary | ICD-10-CM

## 2020-12-14 NOTE — Telephone Encounter (Signed)
Patient is asking if you filled any prescription for her at today's visit. Patient states that a medication was mentioned to help relieve pain from plantar fasciitis; does not remember the name of the medication mentioned. Please advise.

## 2020-12-15 ENCOUNTER — Encounter: Payer: Self-pay | Admitting: Podiatry

## 2020-12-15 MED ORDER — MELOXICAM 15 MG PO TABS: 15.0000 mg | ORAL_TABLET | Freq: Every day | ORAL | 0 refills | Status: DC

## 2020-12-15 NOTE — Progress Notes (Signed)
Subjective:  Patient ID: Amy Bullock, female    DOB: [birth year],  MRN: [id number]  Chief Complaint  Patient presents with  . Foot Pain  . Bunions    Patient presents today for very painful right foot from toes to heel x 5 months.  She says its a pulling pain, tingling, burning and cant hardly walk.  She also wants to discuss bunion left hallux    50 y.o. female presents with the above complaint.  Patient presents with complaint of right plantar fasciitis.  Patient states that the entire plantar band of the foot hurts.  She states being on for 3 to 5 months has progressive gotten worse.  She states she can hardly walk there is burning tingling sensation to her.  She also had a previous bunion surgery done to the right side.  She states that she is experiencing post static dyskinesia type of symptoms.  She denies any other acute complaints.  She would also like to discuss bunion surgery to the left hallux after the right side is healed up.   Review of Systems: Negative except as noted in the HPI. Denies N/V/F/Ch.  Past Medical History:  Diagnosis Date  . Hypertension     Current Outpatient Medications:  .  meloxicam (MOBIC) 15 MG tablet, Take 1 tablet (15 mg total) by mouth daily., Disp: 30 tablet, Rfl: 0 .  fluticasone (FLONASE) 50 MCG/ACT nasal spray, Place 2 sprays into both nostrils daily., Disp: 16 g, Rfl: 0  Social History   Tobacco Use  Smoking Status Never Smoker  Smokeless Tobacco Not on file    No Known Allergies Objective:  There were no vitals filed for this visit. There is no height or weight on file to calculate BMI. Constitutional Well developed. Well nourished.  Vascular Dorsalis pedis pulses palpable bilaterally. Posterior tibial pulses palpable bilaterally. Capillary refill normal to all digits.  No cyanosis or clubbing noted. Pedal hair growth normal.  Neurologic Normal speech. Oriented to person, place, and time. Epicritic sensation to light touch  grossly present bilaterally.  Dermatologic Nails well groomed and normal in appearance. No open wounds. No skin lesions.  Orthopedic: Normal joint ROM without pain or crepitus bilaterally. No visible deformities. Tender to palpation at the calcaneal tuber right. No pain with calcaneal squeeze right. Ankle ROM diminished range of motion right. Silfverskiold Test: positive right.  Left moderate bunion deformity noted with sesamoid position 5 out of 7.  Pain on palpation to the medial eminence.  Hallux valgus deformity noted as well.  This appears to be tracking not a track bound deformity   Radiographs: Taken and reviewed. No acute fractures or dislocations. No evidence of stress fracture.  Plantar heel spur present. Posterior heel spur absent.   Assessment:   1. Valgus deformity of great toe, unspecified laterality   2. Plantar fasciitis of left foot    Plan:  Patient was evaluated and treated and all questions answered.  Plantar Fasciitis, right -I reviewed the x-ray with the patient and educated her on the etiology of plantar fasciitis.  Given the amount of pain she is having through the whole plan I believe patient will benefit from cam boot immobilization.  I discussed that this will help [place] localize the pain and therefore be more targeted with steroid injection.  Patient states understanding -Cam boot was dispensed  Left bunion moderate -I explained to the patient the etiology of bunion deformity emergency room and options were discussed.  Clinically patient is having some  pain to the bunion site and would like to discuss correcting it.  However will for now we will hold off discussing it further until the plantar fasciitis on the right side has completely healed up as she will need the right foot to primarily take the pressure off the left side.  She states understanding  No follow-ups on file.

## 2020-12-15 NOTE — Telephone Encounter (Signed)
sent 

## 2021-01-04 ENCOUNTER — Ambulatory Visit: Payer: 59 | Admitting: Podiatry

## 2022-05-24 ENCOUNTER — Encounter: Payer: Self-pay | Admitting: Internal Medicine

## 2022-05-24 ENCOUNTER — Ambulatory Visit (INDEPENDENT_AMBULATORY_CARE_PROVIDER_SITE_OTHER): Payer: 59 | Admitting: Internal Medicine

## 2022-05-24 VITALS — BP 212/100 | HR 72 | Temp 97.3°F | Ht 63.0 in | Wt 159.0 lb

## 2022-05-24 DIAGNOSIS — Z0000 Encounter for general adult medical examination without abnormal findings: Secondary | ICD-10-CM | POA: Diagnosis not present

## 2022-05-24 DIAGNOSIS — I10 Essential (primary) hypertension: Secondary | ICD-10-CM

## 2022-05-24 DIAGNOSIS — E8881 Metabolic syndrome: Secondary | ICD-10-CM | POA: Diagnosis not present

## 2022-05-24 DIAGNOSIS — Z1211 Encounter for screening for malignant neoplasm of colon: Secondary | ICD-10-CM

## 2022-05-24 MED ORDER — LOSARTAN POTASSIUM-HCTZ 50-12.5 MG PO TABS: 1.0000 | ORAL_TABLET | Freq: Every day | ORAL | 2 refills | Status: DC

## 2022-05-24 NOTE — Progress Notes (Signed)
New Patient Office Visit  Subjective:  Patient ID: Amy Bullock, female    DOB: [birth year]  Age: 51 y.o. MRN: [id number]  CC:  Chief Complaint  Patient presents with   Establish Care    NP- establish care.  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 No pertinent family history.  Social History   Socioeconomic History   Marital status: Single    Spouse name: Not on file   Number of children: Not on file   Years of education: Not on file   Highest education level: Not on file  Occupational History   Not on file  Tobacco Use   Smoking status: Never   Smokeless tobacco: Not on file  Vaping Use   Vaping Use: Never used  Substance and Sexual Activity   Alcohol use: No   Drug use: No   Sexual activity: Yes  Other Topics Concern   Not on file  Social History Narrative   Not on file   Social Determinants of Health   Financial Resource Strain: Not on file  Food Insecurity: Not on file  Transportation Needs: Not on file  Physical Activity: Not on file  Stress: Not on file  Social Connections: Not on file  Intimate Partner Violence: Not on file    ROS Review of Systems  Constitutional: Negative.   HENT: Negative.    Eyes: Negative.   Respiratory: Negative.    Cardiovascular: Negative.   Gastrointestinal: Negative.   Endocrine: Negative.   Genitourinary: Negative.   Musculoskeletal: Negative.    Skin: Negative.   Allergic/Immunologic: Negative.   Neurological: Negative.   Hematological: Negative.   Psychiatric/Behavioral: Negative.    All other systems reviewed and are negative.   Objective:   Today's Vitals: BP (!) 212/100 (BP Location: Right Arm, Patient Position: Sitting, Cuff Size: Normal)   Pulse 72   Temp (!) 97.3 F (36.3 C) (Temporal)   Ht '5\' 3"'$  (1.6 m)   Wt 159 lb (72.1 kg)   SpO2 98%   BMI 28.17 kg/m   Physical Exam Vitals reviewed.  Constitutional:      Appearance: Normal appearance.  HENT:     Head: Normocephalic.     Nose: Nose normal.  Eyes:     Pupils: Pupils are equal, round, and reactive to light.  Cardiovascular:     Rate and Rhythm: Normal rate.     Heart sounds: No murmur heard. Pulmonary:     Effort: Pulmonary effort is normal.     Breath sounds: Normal breath sounds.  Abdominal:     Tenderness: There is no abdominal tenderness. There is no guarding.  Musculoskeletal:     Cervical back: Normal range of motion.  Skin:    General: Skin is warm.  Neurological:     General: No focal deficit present.  Psychiatric:        Mood  and Affect: Mood normal.     Assessment & Plan:   Problem List Items Addressed This Visit       Cardiovascular and Mediastinum   Primary hypertension - Primary     Patient denies any chest pain or shortness of breath there is no history of palpitation or paroxysmal nocturnal dyspnea   patient was advised to follow low-salt low-cholesterol diet    ideally I want to keep systolic blood pressure below 130 mmHg, patient was asked to check blood pressure one times a week and give me a report on that.  Patient will be follow-up in 3 months  or earlier as needed, patient will call me back for any change in the cardiovascular symptoms Patient was advised to buy a book from local bookstore concerning blood pressure and read several chapters  every day.  This will be supplemented by some of the material we will give him  from the office.  Patient should also utilize other resources like YouTube and Internet to learn more about the blood pressure and the diet.      Relevant Medications   losartan-hydrochlorothiazide (HYZAAR) 50-12.5 MG tablet     Other   Metabolic syndrome    - I encouraged the patient to lose weight.  - I educated them on making healthy dietary choices including eating more fruits and vegetables and less fried foods. - I encouraged the patient to exercise more, and educated on the benefits of exercise including weight loss, diabetes prevention, and hypertension prevention.   Dietary counseling with a registered dietician  Referral to a weight management support group (e.g. Weight Watchers, Overeaters Anonymous)  If your BMI is greater than 29 or you have gained more than 15 pounds you should work on weight loss.  Attend a healthy cooking class       Encounter for preventive care    Suggest colonoscopy       Outpatient Encounter Medications as of 05/24/2022  Medication Sig   losartan-hydrochlorothiazide (HYZAAR) 50-12.5 MG tablet Take 1 tablet by mouth daily.   [DISCONTINUED] fluticasone (FLONASE) 50 MCG/ACT nasal spray Place 2 sprays into both nostrils daily.   [DISCONTINUED] meloxicam (MOBIC) 15 MG tablet Take 1 tablet (15 mg total) by mouth daily.   No facility-administered encounter medications on file as of 05/24/2022.    Follow-up: No follow-ups on file.   Cletis Athens, MD

## 2022-05-24 NOTE — Assessment & Plan Note (Signed)
Suggest colonoscopy

## 2022-05-24 NOTE — Assessment & Plan Note (Signed)

## 2022-05-24 NOTE — Assessment & Plan Note (Signed)

## 2022-05-30 ENCOUNTER — Emergency Department
Admission: EM | Admit: 2022-05-30 | Discharge: 2022-05-30 | Disposition: A | Discharge status: Home / Self Care | Payer: 59 | Attending: Emergency Medicine | Admitting: Emergency Medicine

## 2022-05-30 ENCOUNTER — Other Ambulatory Visit: Payer: Self-pay

## 2022-05-30 DIAGNOSIS — R519 Headache, unspecified: Secondary | ICD-10-CM | POA: Diagnosis present

## 2022-05-30 DIAGNOSIS — I10 Essential (primary) hypertension: Secondary | ICD-10-CM

## 2022-05-30 LAB — BASIC METABOLIC PANEL
Anion gap: 7 (ref 5–15)
BUN: 17 mg/dL (ref 6–20)
CO2: 25 mmol/L (ref 22–32)
Calcium: 10.1 mg/dL (ref 8.9–10.3)
Chloride: 108 mmol/L (ref 98–111)
Creatinine, Ser: 1.09 mg/dL — ABNORMAL HIGH (ref 0.44–1.00)
GFR, Estimated: >60 mL/min (ref >60)
Glucose, Bld: 112 mg/dL — ABNORMAL HIGH (ref 70–99)
Potassium: 3.7 mmol/L (ref 3.5–5.1)
Sodium: 140 mmol/L (ref 135–145)

## 2022-05-30 LAB — CBC
HCT: 41.1 % (ref 36.0–46.0)
Hemoglobin: 13.6 g/dL (ref 12.0–15.0)
MCH: 30 pg (ref 26.0–34.0)
MCHC: 33.1 g/dL (ref 30.0–36.0)
MCV: 90.7 fL (ref 80.0–100.0)
Platelets: 250 10*3/uL (ref 150–400)
RBC: 4.53 MIL/uL (ref 3.87–5.11)
RDW: 13 % (ref 11.5–15.5)
WBC: 6.2 10*3/uL (ref 4.0–10.5)
nRBC: 0 % (ref 0.0–0.2)

## 2022-05-30 MED ORDER — CLONIDINE HCL 0.1 MG PO TABS
0.2000 mg | ORAL_TABLET | Freq: Once | ORAL | Status: AC
  Administered 2022-05-30: 0.2 mg via ORAL
  Filled 2022-05-30: qty 2

## 2022-05-30 MED ORDER — LOSARTAN POTASSIUM-HCTZ 100-25 MG PO TABS: 1.0000 | ORAL_TABLET | Freq: Every day | ORAL | 0 refills | Status: DC

## 2022-05-30 NOTE — Addendum Note (Signed)
Addended by: Lacretia Nicks L on: 05/30/2022 03:00 PM   Modules accepted: Orders

## 2022-05-30 NOTE — ED Notes (Signed)
MD aware of high BP. Spoke with pt and family members about medication. Educated pt prior to discharge.

## 2022-05-30 NOTE — ED Triage Notes (Addendum)
Pt comes with c/op hypertension and nose bleed. Pt states she was just started on BP meds. Pt states nosebleed early this am. Pt states elevated Bp at home.   Pt did take BP meds this am

## 2022-05-30 NOTE — ED Provider Notes (Signed)
[facility] Provider Note    Event Date/Time   First MD Initiated Contact with Patient 05/30/22 1021     (approximate)   History   Chief Complaint: Hypertension   HPI  Amy Bullock is a 51 y.o. female with a past history of hypertension, recently started on blood pressure medicine 5 days ago, compliant with medicine who comes ED complaining of nosebleed this morning at 3:30 AM.  Patient worked overnight shift, left work at 7 AM, noticed that she was still having a dull headache which was gradual onset and generalized, so she checked her blood pressure and found it was severely elevated and came to the ED.  Denies vision changes, paresthesias, motor weakness, changes in balance or coordination.  No falls or head trauma.  No thunderclap headache.  Has an appointment June 12, 2022 with her PCP.  No chest pain or shortness of breath.  Patient has not noticed any side effects from Hyzaar 50/12.5 which she has taken over the last 5 days.     Physical Exam   Triage Vital Signs: ED Triage Vitals  Enc Vitals Group     BP 05/30/22 0959 (!) 228/107     Pulse Rate 05/30/22 0959 86     Resp 05/30/22 0959 19     Temp 05/30/22 0959 98 F (36.7 C)     Temp src --      SpO2 05/30/22 0959 95 %     Weight --      Height --      Head Circumference --      Peak Flow --      Pain Score 05/30/22 0958 0     Pain Loc --      Pain Edu? --      Excl. in [place]? --     Most recent vital signs: Vitals:   05/30/22 0959 05/30/22 1040  BP: (!) 228/107 (!) 195/101  Pulse: 86 75  Resp: 19 20  Temp: 98 F (36.7 C)   SpO2: 95% 100%    General: Awake, no distress.  CV:  Good peripheral perfusion.  Regular rate and rhythm Resp:  Normal effort.  Clear to auscultation bilaterally Abd:  No distention.  Other:  No epistaxis.  No facial trauma.   ED Results / Procedures / Treatments   Labs (all labs ordered are listed, but only abnormal results are displayed) Labs  Reviewed  BASIC METABOLIC PANEL - Abnormal; Notable for the following components:      Result Value   Glucose, Bld 112 (*)    Creatinine, Ser 1.09 (*)    All other components within normal limits  CBC     EKG    RADIOLOGY    PROCEDURES:  Procedures   MEDICATIONS ORDERED IN ED: Medications  cloNIDine (CATAPRES) tablet 0.2 mg (0.2 mg Oral Given 05/30/22 1038)     IMPRESSION / MDM / ASSESSMENT AND PLAN / ED COURSE  I reviewed the triage vital signs and the nursing notes.                              Differential diagnosis includes, but is not limited to, uncontrolled hypertension, anemia, AKI, electrolyte abnormalities  Patient's presentation is most consistent with acute presentation with potential threat to life or bodily function.  Patient presents with severe hypertension.  No evidence of endorgan dysfunction.  Neurologically intact.  Doubt ICH.  Will give  clonidine for rapid blood pressure control, increase Hyzaar to dosing to 100/25.  She can continue this dosing until following up with PCP for blood pressure recheck.       FINAL CLINICAL IMPRESSION(S) / ED DIAGNOSES   Final diagnoses:  Uncontrolled hypertension     Rx / DC Orders   ED Discharge Orders          Ordered    losartan-hydrochlorothiazide (HYZAAR) 100-25 MG tablet  Daily        05/30/22 1031             Note:  This document was prepared using Dragon voice recognition software and may include unintentional dictation errors.   Carrie Mew, MD 05/30/22 1045

## 2022-05-31 ENCOUNTER — Other Ambulatory Visit: Payer: Self-pay

## 2022-05-31 ENCOUNTER — Telehealth: Payer: Self-pay

## 2022-05-31 DIAGNOSIS — Z1211 Encounter for screening for malignant neoplasm of colon: Secondary | ICD-10-CM

## 2022-05-31 MED ORDER — NA SULFATE-K SULFATE-MG SULF 17.5-3.13-1.6 GM/177ML PO SOLN: 1.0000 | Freq: Once | ORAL | 0 refills | Status: AC

## 2022-05-31 NOTE — Telephone Encounter (Signed)
Gastroenterology Pre-Procedure Review  Request Date: 07/03/22 Requesting Physician: Dr. Allen Norris  PATIENT REVIEW QUESTIONS: The patient responded to the following health history questions as indicated:    1. Are you having any GI issues? no 2. Do you have a personal history of Polyps? no 3. Do you have a family history of Colon Cancer or Polyps? no 4. Diabetes Mellitus? no 5. Joint replacements in the past 12 months?no 6. Major health problems in the past 3 months?no 7. Any artificial heart valves, MVP, or defibrillator?no    MEDICATIONS & ALLERGIES:    Patient reports the following regarding taking any anticoagulation/antiplatelet therapy:   Plavix, Coumadin, Eliquis, Xarelto, Lovenox, Pradaxa, Brilinta, or Effient? no Aspirin? no  Patient confirms/reports the following medications:  Current Outpatient Medications  Medication Sig Dispense Refill   losartan-hydrochlorothiazide (HYZAAR) 100-25 MG tablet Take 1 tablet by mouth daily. 30 tablet 0   No current facility-administered medications for this visit.    Patient confirms/reports the following allergies:  No Known Allergies  No orders of the defined types were placed in this encounter.   AUTHORIZATION INFORMATION Primary Insurance: 1D#: Group #:  Secondary Insurance: 1D#: Group #:  SCHEDULE INFORMATION: Date: 07/03/22 Time: Location: [place]

## 2022-06-12 ENCOUNTER — Ambulatory Visit (INDEPENDENT_AMBULATORY_CARE_PROVIDER_SITE_OTHER): Payer: 59 | Admitting: Internal Medicine

## 2022-06-12 ENCOUNTER — Encounter: Payer: Self-pay | Admitting: Internal Medicine

## 2022-06-12 VITALS — BP 145/82 | HR 78 | Ht 63.0 in | Wt 159.0 lb

## 2022-06-12 DIAGNOSIS — E8881 Metabolic syndrome: Secondary | ICD-10-CM | POA: Diagnosis not present

## 2022-06-12 DIAGNOSIS — I10 Essential (primary) hypertension: Secondary | ICD-10-CM | POA: Diagnosis not present

## 2022-06-12 DIAGNOSIS — Z0000 Encounter for general adult medical examination without abnormal findings: Secondary | ICD-10-CM

## 2022-06-12 MED ORDER — LOSARTAN POTASSIUM-HCTZ 100-25 MG PO TABS: 1.0000 | ORAL_TABLET | Freq: Every day | ORAL | 3 refills | Status: DC

## 2022-06-12 NOTE — Assessment & Plan Note (Signed)

## 2022-06-12 NOTE — Assessment & Plan Note (Signed)
Suggest through chart and colonoscopy

## 2022-06-12 NOTE — Progress Notes (Signed)
Established Patient Office Visit  Subjective:  Patient ID: Amy Bullock, female    DOB: [birth year]  Age: 51 y.o. 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 No pertinent family history.  Social History   Socioeconomic History   Marital status: Single    Spouse name: Not on file   Number of children: Not on file   Years of education: Not on file   Highest education level: Not on file  Occupational History   Not on file  Tobacco Use   Smoking status: Never   Smokeless tobacco: Not on file  Vaping Use   Vaping Use: Never used  Substance and Sexual Activity   Alcohol use: No   Drug use: No   Sexual activity: Yes  Other Topics Concern   Not on file  Social History Narrative   Not on file   Social Determinants of Health   Financial Resource Strain: Not on file  Food Insecurity: Not on file  Transportation Needs: Not on file  Physical Activity: Not on file  Stress: Not on file  Social Connections: Not on file  Intimate Partner Violence: Not on file     Current Outpatient Medications:    losartan-hydrochlorothiazide (HYZAAR) 100-25 MG tablet, Take 1 tablet by mouth daily., Disp: 30 tablet, Rfl: 0   No Known Allergies  ROS Review of Systems  Constitutional: Negative.   HENT: Negative.    Eyes: Negative.   Respiratory: Negative.    Cardiovascular: Negative.   Gastrointestinal: Negative.   Endocrine: Negative.   Genitourinary: Negative.   Musculoskeletal: Negative.   Skin: Negative.    Allergic/Immunologic: Negative.   Neurological: Negative.   Hematological: Negative.   Psychiatric/Behavioral: Negative.    All other systems reviewed and are negative.     Objective:    Physical Exam Vitals reviewed.  Constitutional:      Appearance: Normal appearance.  HENT:     Mouth/Throat:     Mouth: Mucous membranes are moist.  Eyes:     Pupils: Pupils are equal, round, and reactive to light.  Neck:     Vascular: No carotid bruit.  Cardiovascular:     Rate and Rhythm: Normal rate and regular rhythm.     Pulses: Normal pulses.     Heart sounds: Normal heart sounds.  Pulmonary:     Effort: Pulmonary effort is normal.     Breath sounds: Normal breath sounds.  Abdominal:     General: Bowel sounds are normal.     Palpations: Abdomen is soft. There is no hepatomegaly, splenomegaly or mass.     Tenderness: There is no abdominal tenderness.     Hernia: No hernia is present.  Musculoskeletal:        General: No tenderness.     Cervical back: Neck supple.     Right lower leg: No edema.     Left lower leg: No edema.  Skin:    Findings: No rash.  Neurological:     Mental Status: She is alert and oriented to person, place, and time.     Motor: No weakness.  Psychiatric:        Mood and Affect: Mood and affect normal.        Behavior: Behavior normal.     BP (!) 145/82   Pulse 78   Ht '5\' 3"'$  (1.6 m)   Wt 159 lb (72.1 kg)   BMI 28.17 kg/m  Wt Readings from Last 3 Encounters:  06/12/22 159 lb (72.1 kg)  05/24/22 159 lb (72.1 kg)  05/21/18 160 lb (72.6 kg)     Health Maintenance Due  Topic Date Due   HIV Screening  Never done   Hepatitis C Screening  Never done   PAP SMEAR-Modifier  Never done   COLONOSCOPY (Pts 45-21yr Insurance coverage will need to be confirmed)  Never done   MAMMOGRAM  12/16/2019   Zoster Vaccines- Shingrix (1 of 2) Never done   COVID-19 Vaccine (4 - Moderna series) 01/24/2021   INFLUENZA VACCINE  Never done    There are no  preventive care reminders to display for this patient.  No results found for: "TSH" Lab Results  Component Value Date   WBC 6.2 05/30/2022   HGB 13.6 05/30/2022   HCT 41.1 05/30/2022   MCV 90.7 05/30/2022   PLT 250 05/30/2022   Lab Results  Component Value Date   NA 140 05/30/2022   K 3.7 05/30/2022   CO2 25 05/30/2022   GLUCOSE 112 (H) 05/30/2022   BUN 17 05/30/2022   CREATININE 1.09 (H) 05/30/2022   CALCIUM 10.1 05/30/2022   ANIONGAP 7 05/30/2022   No results found for: "CHOL" No results found for: "HDL" No results found for: "LDLCALC" No results found for: "TRIG" No results found for: "CHOLHDL" No results found for: "HGBA1C"    Assessment & Plan:   Problem List Items Addressed This Visit       Cardiovascular and Mediastinum   Primary hypertension - Primary     Patient denies any chest pain or shortness of breath there is no history of palpitation or paroxysmal nocturnal dyspnea   patient was advised to follow low-salt low-cholesterol diet    ideally I want to keep systolic blood pressure below 130 mmHg, patient was asked to check blood pressure one times a week and give me a report on that.  Patient will be follow-up in 3 months  or earlier as needed, patient will call me back for any change in the cardiovascular symptoms Patient was advised to buy a book from local bookstore concerning blood pressure and read several chapters  every day.  This will be supplemented by some of the material we will give him from the office.  Patient should also utilize other resources like YouTube and Internet to learn more about the blood pressure and the diet.        Other   Metabolic syndrome    - I encouraged the patient to lose weight.  - I educated them on making healthy dietary choices including eating more fruits and vegetables and less fried foods. - I encouraged the patient to exercise more, and educated on the benefits of exercise including weight loss, diabetes  prevention, and hypertension prevention.   Dietary counseling with a registered dietician  Referral to a weight management support group (e.g. Weight Watchers, Overeaters Anonymous)  If your BMI is greater than 29 or you have gained more than 15 pounds you  should work on weight loss.  Attend a healthy cooking class       Encounter for preventive care    Suggest through chart and colonoscopy       No orders of the defined types were placed in this encounter.   Follow-up: No follow-ups on file.    Cletis Athens, MD

## 2022-06-12 NOTE — Assessment & Plan Note (Signed)

## 2022-06-26 ENCOUNTER — Encounter: Payer: Self-pay | Admitting: Gastroenterology

## 2022-07-03 ENCOUNTER — Encounter
Admission: RE | Disposition: A | Discharge status: Home / Self Care | Payer: Self-pay | Source: Home / Self Care | Attending: Gastroenterology

## 2022-07-03 ENCOUNTER — Ambulatory Visit: Payer: 59 | Admitting: Anesthesiology

## 2022-07-03 ENCOUNTER — Ambulatory Visit
Admission: RE | Admit: 2022-07-03 | Discharge: 2022-07-03 | Disposition: A | Discharge status: Home / Self Care | Payer: 59 | Attending: Gastroenterology | Admitting: Gastroenterology

## 2022-07-03 ENCOUNTER — Other Ambulatory Visit: Payer: Self-pay

## 2022-07-03 ENCOUNTER — Encounter: Payer: Self-pay | Admitting: Gastroenterology

## 2022-07-03 DIAGNOSIS — D124 Benign neoplasm of descending colon: Secondary | ICD-10-CM | POA: Insufficient documentation

## 2022-07-03 DIAGNOSIS — D123 Benign neoplasm of transverse colon: Secondary | ICD-10-CM | POA: Diagnosis not present

## 2022-07-03 DIAGNOSIS — K635 Polyp of colon: Secondary | ICD-10-CM | POA: Diagnosis not present

## 2022-07-03 DIAGNOSIS — K640 First degree hemorrhoids: Secondary | ICD-10-CM | POA: Insufficient documentation

## 2022-07-03 DIAGNOSIS — Z1211 Encounter for screening for malignant neoplasm of colon: Secondary | ICD-10-CM | POA: Diagnosis not present

## 2022-07-03 DIAGNOSIS — I10 Essential (primary) hypertension: Secondary | ICD-10-CM | POA: Diagnosis not present

## 2022-07-03 HISTORY — PX: POLYPECTOMY: SHX5525

## 2022-07-03 HISTORY — PX: COLONOSCOPY WITH PROPOFOL: SHX5780

## 2022-07-03 SURGERY — COLONOSCOPY WITH PROPOFOL
Anesthesia: General | Site: Rectum

## 2022-07-03 MED ORDER — PROPOFOL 10 MG/ML IV BOLUS
INTRAVENOUS | Status: DC | PRN
  Administered 2022-07-03: 30 mg via INTRAVENOUS
  Administered 2022-07-03: 20 mg via INTRAVENOUS
  Administered 2022-07-03: 50 mg via INTRAVENOUS
  Administered 2022-07-03: 100 mg via INTRAVENOUS
  Administered 2022-07-03: 50 mg via INTRAVENOUS

## 2022-07-03 MED ORDER — LIDOCAINE HCL (CARDIAC) PF 100 MG/5ML IV SOSY
PREFILLED_SYRINGE | INTRAVENOUS | Status: DC | PRN
  Administered 2022-07-03: 50 mg via INTRAVENOUS

## 2022-07-03 MED ORDER — LACTATED RINGERS IV SOLN: INTRAVENOUS | Status: DC | PRN

## 2022-07-03 MED ORDER — STERILE WATER FOR IRRIGATION IR SOLN
Status: DC | PRN
  Administered 2022-07-03: 60 mL

## 2022-07-03 MED ORDER — SODIUM CHLORIDE 0.9 % IV SOLN: INTRAVENOUS | Status: DC

## 2022-07-03 MED ORDER — STERILE WATER FOR IRRIGATION IR SOLN
Status: DC | PRN
  Administered 2022-07-03: 1

## 2022-07-03 SURGICAL SUPPLY — 21 items

## 2022-07-03 NOTE — Anesthesia Postprocedure Evaluation (Signed)
Anesthesia Post Note  Patient: Amy Bullock  Procedure(s) Performed: COLONOSCOPY WITH PROPOFOL (Rectum) POLYPECTOMY (Rectum)  Patient location during evaluation: PACU Anesthesia Type: General Level of consciousness: awake and alert Pain management: pain level controlled Vital Signs Assessment: post-procedure vital signs reviewed and stable Respiratory status: spontaneous breathing, nonlabored ventilation, respiratory function stable and patient connected to nasal cannula oxygen Cardiovascular status: blood pressure returned to baseline and stable Postop Assessment: no apparent nausea or vomiting Anesthetic complications: no   No notable events documented.   Last Vitals:  Vitals:   07/03/22 0810 07/03/22 0923  BP: (!) 175/84   Pulse: 75   Temp: 36.6 C (!) (P) 36.3 C  SpO2: 99%     Last Pain:  Vitals:   07/03/22 0923  TempSrc:   PainSc: (P) 0-No pain                 Ilene Qua

## 2022-07-03 NOTE — Op Note (Signed)
[facility] Gastroenterology Patient Name: Amy Bullock Procedure Date: 07/03/2022 8:53 AM MRN: [id number] Account #: [id number] Date of Birth: 1971/02/09 Admit Type: Outpatient Age: 51 Room: [facility] OR ROOM 01 Gender: Female Note Status: Finalized Instrument Name: [id number] Procedure:             Colonoscopy Indications:           Screening for colorectal malignant neoplasm Providers:             Lucilla Lame MD, MD Referring MD:          Cletis Athens, MD (Referring MD) Medicines:             Propofol per Anesthesia Complications:         No immediate complications. Procedure:             Pre-Anesthesia Assessment:                        - Prior to the procedure, a History and Physical was                         performed, and patient medications and allergies were                         reviewed. The patient's tolerance of previous                         anesthesia was also reviewed. The risks and benefits                         of the procedure and the sedation options and risks                         were discussed with the patient. All questions were                         answered, and informed consent was obtained. Prior                         Anticoagulants: The patient has taken no previous                         anticoagulant or antiplatelet agents. ASA Grade                         Assessment: II - A patient with mild systemic disease.                         After reviewing the risks and benefits, the patient                         was deemed in satisfactory condition to undergo the                         procedure.                        After obtaining informed consent, the colonoscope was  passed under direct vision. Throughout the procedure,                         the patient's blood pressure, pulse, and oxygen                         saturations were monitored continuously. The                         Colonoscope was  introduced through the anus and                         advanced to the the cecum, identified by appendiceal                         orifice and ileocecal valve. The colonoscopy was                         performed without difficulty. The patient tolerated                         the procedure well. The quality of the bowel                         preparation was excellent. Findings:      The perianal and digital rectal examinations were normal.      A 3 mm polyp was found in the descending colon. The polyp was sessile.       The polyp was removed with a cold biopsy forceps. Resection and       retrieval were complete.      A 3 mm polyp was found in the transverse colon. The polyp was sessile.       The polyp was removed with a cold biopsy forceps. Resection and       retrieval were complete.      Non-bleeding internal hemorrhoids were found during retroflexion. The       hemorrhoids were Grade I (internal hemorrhoids that do not prolapse). Impression:            - One 3 mm polyp in the descending colon, removed with                         a cold biopsy forceps. Resected and retrieved.                        - One 3 mm polyp in the transverse colon, removed with                         a cold biopsy forceps. Resected and retrieved.                        - Non-bleeding internal hemorrhoids. Recommendation:        - Discharge patient to home.                        - Resume previous diet.                        - Continue present medications.                        -  Await pathology results.                        - If the pathology report reveals adenomatous tissue,                         then repeat the colonoscopy for surveillance in 7                         years. Procedure Code(s):     --- Professional ---                        [phone], Colonoscopy, flexible; with biopsy, single or                         multiple Diagnosis Code(s):     --- Professional ---                         Z12.11, Encounter for screening for malignant neoplasm                         of colon                        K63.5, Polyp of colon CPT copyright 2019 American Medical Association. All rights reserved. The codes documented in this report are preliminary and upon coder review may  be revised to meet current compliance requirements. Lucilla Lame MD, MD 07/03/2022 9:20:43 AM This report has been signed electronically. Number of Addenda: 0 Note Initiated On: 07/03/2022 8:53 AM Scope Withdrawal Time: 0 hours 8 minutes 22 seconds  Total Procedure Duration: 0 hours 14 minutes 55 seconds  Estimated Blood Loss:  Estimated blood loss: none.      [facility]

## 2022-07-03 NOTE — Transfer of Care (Signed)
Immediate Anesthesia Transfer of Care Note  Patient: Amy Bullock  Procedure(s) Performed: COLONOSCOPY WITH PROPOFOL (Rectum) POLYPECTOMY (Rectum)  Patient Location: PACU  Anesthesia Type: General  Level of Consciousness: awake, alert  and patient cooperative  Airway and Oxygen Therapy: Patient Spontanous Breathing and Patient connected to supplemental oxygen  Post-op Assessment: Post-op Vital signs reviewed, Patient's Cardiovascular Status Stable, Respiratory Function Stable, Patent Airway and No signs of Nausea or vomiting  Post-op Vital Signs: Reviewed and stable  Complications: No notable events documented.

## 2022-07-03 NOTE — H&P (Signed)
   Lucilla Lame, MD [facility] [address] [place], [place] [address] Phone: [phone] Fax : [phone]  Primary Care Physician:  Cletis Athens, MD Primary Gastroenterologist:  Dr. Allen Norris  Pre-Procedure History & Physical: HPI:  Amy Bullock is a 51 y.o. female is here for a screening colonoscopy.   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 Authorizing Provider  losartan-hydrochlorothiazide (HYZAAR) 100-25 MG tablet Take 1 tablet by mouth daily. 06/12/22  Yes Cletis Athens, MD    Allergies as of 05/31/2022   (No Known Allergies)    History reviewed. No pertinent family history.  Social History   Socioeconomic History   Marital status: Single    Spouse name: Not on file   Number of children: Not on file   Years of education: Not on file   Highest education level: Not on file  Occupational History   Not on file  Tobacco Use   Smoking status: Never   Smokeless tobacco: Never  Vaping Use   Vaping Use: Never used  Substance and Sexual Activity   Alcohol use: No   Drug use: No   Sexual activity: Yes  Other Topics Concern   Not on file  Social History Narrative   Not on file   Social Determinants of Health   Financial Resource Strain: Not on file  Food Insecurity: Not on file  Transportation Needs: Not on file  Physical Activity: Not on file  Stress: Not on file  Social Connections: Not on file  Intimate Partner Violence: Not on file    Review of Systems: See HPI, otherwise negative ROS  Physical Exam: BP (!) 175/84   Pulse 75   Temp 97.9 F (36.6 C)  (Temporal)   Ht '5\' 3"'$  (1.6 m)   Wt 71.2 kg   SpO2 99%   BMI 27.81 kg/m  General:   Alert,  pleasant and cooperative in NAD Head:  Normocephalic and atraumatic. Neck:  Supple; no masses or thyromegaly. Lungs:  Clear throughout to auscultation.    Heart:  Regular rate and rhythm. Abdomen:  Soft, nontender and nondistended. Normal bowel sounds, without guarding, and without rebound.   Neurologic:  Alert and  oriented x4;  grossly normal neurologically.  Impression/Plan: Amy Bullock is now here to undergo a screening colonoscopy.  Risks, benefits, and alternatives regarding colonoscopy have been reviewed with the patient.  Questions have been answered.  All parties agreeable.

## 2022-07-03 NOTE — Anesthesia Preprocedure Evaluation (Signed)
Anesthesia Evaluation  Patient identified by MRN, date of birth, ID band Patient awake    Reviewed: Allergy & Precautions, NPO status , Patient's Chart, lab work & pertinent test results  History of Anesthesia Complications Negative for: history of anesthetic complications  Airway Mallampati: II  TM Distance: >3 FB Neck ROM: full    Dental  (+) Teeth Intact   Pulmonary neg pulmonary ROS,    Pulmonary exam normal        Cardiovascular hypertension, On Medications negative cardio ROS Normal cardiovascular exam     Neuro/Psych negative neurological ROS  negative psych ROS   GI/Hepatic negative GI ROS, Neg liver ROS,   Endo/Other  negative endocrine ROS  Renal/GU negative Renal ROS  negative genitourinary   Musculoskeletal negative musculoskeletal ROS (+)   Abdominal Normal abdominal exam  (+)   Peds negative pediatric ROS (+)  Hematology negative hematology ROS (+)   Anesthesia Other Findings Past Medical History: No date: Hypertension  Past Surgical History: No date: ABDOMINAL HYSTERECTOMY 05/11/15: BREAST BIOPSY; Left     Comment:  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 06/11/2015: HALLUX VALGUS AUSTIN; Right     Comment:  Procedure: HALLUX VALGUS AUSTIN;  Surgeon: Sharlotte Alamo,               MD;  Location: [facility] ORS;  Service: Podiatry;  Laterality:              Right;  BMI    Body Mass Index: 28.17 kg/m      Reproductive/Obstetrics negative OB ROS                             Anesthesia Physical Anesthesia Plan  ASA: 2  Anesthesia Plan: General   Post-op Pain Management: Minimal or no pain anticipated   Induction: Intravenous  PONV Risk Score and Plan: Propofol infusion and TIVA  Airway Management Planned: Natural Airway and Nasal Cannula  Additional Equipment:   Intra-op Plan:   Post-operative Plan:   Informed Consent: I have reviewed the patients History and Physical,  chart, labs and discussed the procedure including the risks, benefits and alternatives for the proposed anesthesia with the patient or authorized representative who has indicated his/her understanding and acceptance.     Dental Advisory Given  Plan Discussed with: Anesthesiologist, CRNA and Surgeon  Anesthesia Plan Comments: (Patient consented for risks of anesthesia including but not limited to:  - adverse reactions to medications - risk of airway placement if required - damage to eyes, teeth, lips or other oral mucosa - nerve damage due to positioning  - sore throat or hoarseness - Damage to heart, brain, nerves, lungs, other parts of body or loss of life  Patient voiced understanding.)        Anesthesia Quick Evaluation

## 2022-07-04 ENCOUNTER — Encounter: Payer: Self-pay | Admitting: Gastroenterology

## 2022-07-05 LAB — SURGICAL PATHOLOGY

## 2022-07-10 ENCOUNTER — Encounter: Payer: Self-pay | Admitting: Gastroenterology

## 2022-12-29 DIAGNOSIS — H524 Presbyopia: Secondary | ICD-10-CM | POA: Diagnosis not present

## 2023-04-09 DIAGNOSIS — R200 Anesthesia of skin: Secondary | ICD-10-CM | POA: Diagnosis not present

## 2023-04-09 DIAGNOSIS — S66911A Strain of unspecified muscle, fascia and tendon at wrist and hand level, right hand, initial encounter: Secondary | ICD-10-CM | POA: Diagnosis not present

## 2023-06-05 DIAGNOSIS — G5603 Carpal tunnel syndrome, bilateral upper limbs: Secondary | ICD-10-CM | POA: Diagnosis not present

## 2023-06-05 DIAGNOSIS — G5622 Lesion of ulnar nerve, left upper limb: Secondary | ICD-10-CM | POA: Diagnosis not present

## 2023-06-05 DIAGNOSIS — G5621 Lesion of ulnar nerve, right upper limb: Secondary | ICD-10-CM | POA: Diagnosis not present

## 2023-06-07 DIAGNOSIS — G5601 Carpal tunnel syndrome, right upper limb: Secondary | ICD-10-CM | POA: Diagnosis not present

## 2023-07-07 DIAGNOSIS — U071 COVID-19: Secondary | ICD-10-CM | POA: Diagnosis not present

## 2023-07-07 DIAGNOSIS — Z20822 Contact with and (suspected) exposure to covid-19: Secondary | ICD-10-CM | POA: Diagnosis not present

## 2023-07-12 DIAGNOSIS — H6692 Otitis media, unspecified, left ear: Secondary | ICD-10-CM | POA: Diagnosis not present

## 2023-07-12 DIAGNOSIS — J0120 Acute ethmoidal sinusitis, unspecified: Secondary | ICD-10-CM | POA: Diagnosis not present
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
## 2024-05-01 ENCOUNTER — Ambulatory Visit: Payer: Self-pay | Admitting: Nurse Practitioner

## 2024-05-01 ENCOUNTER — Encounter: Payer: Self-pay | Admitting: Nurse Practitioner

## 2024-05-01 VITALS — BP 138/70 | HR 62 | Temp 98.2°F | Resp 16 | Ht 64.0 in | Wt 165.2 lb

## 2024-05-01 DIAGNOSIS — E782 Mixed hyperlipidemia: Secondary | ICD-10-CM | POA: Diagnosis not present

## 2024-05-01 DIAGNOSIS — Z90710 Acquired absence of both cervix and uterus: Secondary | ICD-10-CM | POA: Diagnosis not present

## 2024-05-01 DIAGNOSIS — I10 Essential (primary) hypertension: Secondary | ICD-10-CM

## 2024-05-01 DIAGNOSIS — Z833 Family history of diabetes mellitus: Secondary | ICD-10-CM

## 2024-05-01 MED ORDER — LOSARTAN POTASSIUM-HCTZ 100-25 MG PO TABS: 1.0000 | ORAL_TABLET | Freq: Every day | ORAL | 3 refills | Status: DC

## 2024-05-01 NOTE — Progress Notes (Signed)
 [facility] [address] [place], [place] [address]  Internal MEDICINE  Office Visit Note  Patient Name: Amy Bullock  [id number]  [id number]  Date of Service: 05/01/2024   Complaints/HPI Pt is here for establishment of PCP. Chief Complaint  Patient presents with   New Patient (Initial Visit)    Est care.     HPI Amy Bullock presents for a new patient visit to establish care.  Well-appearing 53 y.o. female with hypertension. She is only on 1 prescription medication which is losartan -hydrochlorothiazide  Surgical history significant for hysterectomy.  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  Home: live at home alone  Diet: fair  Exercise: walking daily  Tobacco use: none  Alcohol use: none  Illicit drug use: none  Routine CRC screening: due in 2030  Routine mammogram: done in February this year at [facility]. DEXA scan: not due yet Pap smear: discontinued, had hysterectomy.  Labs: due for routine labs  New or worsening pain: none   Current Medication: Outpatient Encounter Medications as of 05/01/2024  Medication Sig   losartan -hydrochlorothiazide (HYZAAR) 100-25 MG tablet Take 1 tablet by mouth daily.   [DISCONTINUED] losartan -hydrochlorothiazide (HYZAAR) 100-25 MG tablet Take 1 tablet by mouth daily.   No facility-administered encounter medications on file as of 05/01/2024.    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   COLONOSCOPY WITH PROPOFOL  N/A 07/03/2022   Procedure: COLONOSCOPY WITH PROPOFOL ;  Surgeon: Jinny Carmine, MD;  Location: [facility];  Service: Endoscopy;  Laterality: N/A;   HALLUX VALGUS AUSTIN Right 06/11/2015   Procedure: HALLUX VALGUS AUSTIN;  Surgeon: Krystal Rosella, MD;  Location: [facility] OR[facility];  Service: Podiatry;  Laterality: Right;   POLYPECTOMY N/A  07/03/2022   Procedure: POLYPECTOMY;  Surgeon: Jinny Carmine, MD;  Location: [facility];  Service: Endoscopy;  Laterality: N/A;    Medical History: Past Medical History:  Diagnosis Date   Hypertension     Family History: History reviewed. No pertinent family history.  Social History   Socioeconomic History   Marital status: Single    Spouse name: Not on file   Number of children: Not on file   Years of education: Not on file   Highest education level: Not on file  Occupational History   Not on file  Tobacco Use   Smoking status: Never   Smokeless tobacco: Never  Vaping Use   Vaping status: Never Used  Substance and Sexual Activity   Alcohol use: No   Drug use: No   Sexual activity: Not Currently  Other Topics Concern   Not on file  Social History Narrative   Not on file   Social Drivers of Health   Financial Resource Strain: Not on file  Food Insecurity: Not on file  Transportation Needs: Not on file  Physical Activity: Not on file  Stress: Not on file  Social Connections: Not on file  Intimate Partner Violence: Not on file     Review of Systems  Constitutional:  Negative for chills, fatigue and unexpected weight change.  HENT:  Negative for congestion, postnasal drip, rhinorrhea, sneezing and sore throat.   Eyes:  Negative for redness.  Respiratory: Negative.  Negative for cough, chest tightness, shortness of breath and wheezing.   Cardiovascular: Negative.  Negative for chest pain and palpitations.  Gastrointestinal:  Negative for  abdominal pain, constipation, diarrhea, nausea and vomiting.  Genitourinary:  Negative for dysuria and frequency.  Musculoskeletal:  Negative for arthralgias, back pain, joint swelling and neck pain.  Skin:  Negative for rash.  Neurological: Negative.  Negative for tremors and numbness.  Hematological:  Negative for adenopathy. Does not bruise/bleed easily.  Psychiatric/Behavioral:  Negative for behavioral problems  (Depression), sleep disturbance and suicidal ideas. The patient is not nervous/anxious.     Vital Signs: BP 138/70 Comment: 152/98  Pulse 62   Temp 98.2 F (36.8 C)   Resp 16   Ht 5' 4 (1.626 m)   Wt 165 lb 3.2 oz (74.9 kg)   SpO2 96%   BMI 28.36 kg/m    Physical Exam Vitals reviewed.  Constitutional:      General: She is not in acute distress.    Appearance: Normal appearance. She is not ill-appearing.  HENT:     Head: Normocephalic and atraumatic.  Eyes:     Pupils: Pupils are equal, round, and reactive to light.  Cardiovascular:     Rate and Rhythm: Normal rate and regular rhythm.  Pulmonary:     Effort: Pulmonary effort is normal. No respiratory distress.  Neurological:     Mental Status: She is alert and oriented to person, place, and time.  Psychiatric:        Mood and Affect: Mood normal.        Behavior: Behavior normal.       Assessment/Plan: 1. Primary hypertension (Primary) Routine labs ordered  - CBC with Differential/Platelet - CMP14+EGFR - Lipid Profile - Hgb A1C w/o eAG  2. Mixed hyperlipidemia Routine labs ordered  - CBC with Differential/Platelet - CMP14+EGFR - Lipid Profile - Hgb A1C w/o eAG  3. Family history of diabetes mellitus Routine labs ordered  - CBC with Differential/Platelet - CMP14+EGFR - Lipid Profile - Hgb A1C w/o eAG  4. Absence of both cervix and uterus, acquired Routine pap smear discontinued     General Counseling: marche hottenstein understanding of the findings of todays visit and agrees with plan of treatment. I have discussed any further diagnostic evaluation that may be needed or ordered today. We also reviewed her medications today. she has been encouraged to call the office with any questions or concerns that should arise related to todays visit.    Orders Placed This Encounter  Procedures   CBC with Differential/Platelet   CMP14+EGFR   Lipid Profile   Hgb A1C w/o eAG    Meds ordered this  encounter  Medications   losartan -hydrochlorothiazide (HYZAAR) 100-25 MG tablet    Sig: Take 1 tablet by mouth daily.    Dispense:  90 tablet    Refill:  3    For future refills, keep on file.    Return in about 1 month (around 06/01/2024) for CPE, Iva Montelongo PCP and review labs, have labs done prior to visit. .  Time spent:30 Minutes Time spent with patient included reviewing progress notes, labs, imaging studies, and discussing plan for follow up.   [place] Controlled Substance Database was reviewed by me for overdose risk score (ORS)   This patient was seen by Mardy Maxin, FNP-C in collaboration with Dr. Sigrid Bathe as a part of collaborative care agreement.   Cristabel Bicknell R. Maxin, MSN, FNP-C Internal Medicine

## 2024-05-07 ENCOUNTER — Other Ambulatory Visit: Payer: Self-pay

## 2024-05-07 DIAGNOSIS — I10 Essential (primary) hypertension: Secondary | ICD-10-CM

## 2024-05-07 MED ORDER — LOSARTAN POTASSIUM-HCTZ 100-25 MG PO TABS: 1.0000 | ORAL_TABLET | Freq: Every day | ORAL | 3 refills | Status: AC

## 2024-05-20 LAB — LIPID PANEL
Chol/HDL Ratio: 3.8 ratio (ref 0.0–4.4)
Cholesterol, Total: 218 mg/dL — ABNORMAL HIGH (ref 100–199)
HDL: 57 mg/dL (ref >39)
LDL Chol Calc (NIH): 137 mg/dL — ABNORMAL HIGH (ref 0–99)
Triglycerides: 135 mg/dL (ref 0–149)
VLDL Cholesterol Cal: 24 mg/dL (ref 5–40)

## 2024-05-20 LAB — CBC WITH DIFFERENTIAL/PLATELET
Basophils Absolute: 0.1 x10E3/uL (ref 0.0–0.2)
Basos: 2 %
EOS (ABSOLUTE): 0.3 x10E3/uL (ref 0.0–0.4)
Eos: 4 %
Hematocrit: 37.7 % (ref 34.0–46.6)
Hemoglobin: 12.4 g/dL (ref 11.1–15.9)
Immature Grans (Abs): 0 x10E3/uL (ref 0.0–0.1)
Immature Granulocytes: 0 %
Lymphocytes Absolute: 3 x10E3/uL (ref 0.7–3.1)
Lymphs: 50 %
MCH: 30.5 pg (ref 26.6–33.0)
MCHC: 32.9 g/dL (ref 31.5–35.7)
MCV: 93 fL (ref 79–97)
Monocytes Absolute: 0.4 x10E3/uL (ref 0.1–0.9)
Monocytes: 7 %
Neutrophils Absolute: 2.2 x10E3/uL (ref 1.4–7.0)
Neutrophils: 37 %
Platelets: 254 x10E3/uL (ref 150–450)
RBC: 4.07 x10E6/uL (ref 3.77–5.28)
RDW: 13.2 % (ref 11.7–15.4)
WBC: 6 x10E3/uL (ref 3.4–10.8)

## 2024-05-20 LAB — CMP14+EGFR
ALT: 25 IU/L (ref 0–32)
AST: 23 IU/L (ref 0–40)
Albumin: 4.2 g/dL (ref 3.8–4.9)
Alkaline Phosphatase: 81 IU/L (ref 44–121)
BUN/Creatinine Ratio: 11 (ref 9–23)
BUN: 14 mg/dL (ref 6–24)
Bilirubin Total: 0.2 mg/dL (ref 0.0–1.2)
CO2: 20 mmol/L (ref 20–29)
Calcium: 9.7 mg/dL (ref 8.7–10.2)
Chloride: 104 mmol/L (ref 96–106)
Creatinine, Ser: 1.26 mg/dL — ABNORMAL HIGH (ref 0.57–1.00)
Globulin, Total: 3 g/dL (ref 1.5–4.5)
Glucose: 100 mg/dL — ABNORMAL HIGH (ref 70–99)
Potassium: 3.6 mmol/L (ref 3.5–5.2)
Sodium: 141 mmol/L (ref 134–144)
Total Protein: 7.2 g/dL (ref 6.0–8.5)
eGFR: 51 mL/min/1.73 — ABNORMAL LOW (ref >59)

## 2024-05-20 LAB — HGB A1C W/O EAG: Hgb A1c MFr Bld: 6.4 % — ABNORMAL HIGH (ref 4.8–5.6)

## 2024-05-29 ENCOUNTER — Encounter: Payer: Self-pay | Admitting: Nurse Practitioner

## 2024-05-29 DIAGNOSIS — Z90710 Acquired absence of both cervix and uterus: Secondary | ICD-10-CM | POA: Insufficient documentation

## 2024-05-29 DIAGNOSIS — Z833 Family history of diabetes mellitus: Secondary | ICD-10-CM | POA: Insufficient documentation

## 2024-05-29 DIAGNOSIS — E782 Mixed hyperlipidemia: Secondary | ICD-10-CM | POA: Insufficient documentation

## 2024-06-05 ENCOUNTER — Ambulatory Visit: Payer: Self-pay | Admitting: Nurse Practitioner

## 2024-06-05 NOTE — Progress Notes (Signed)
 Will discuss lab results at upcoming visit next week

## 2024-06-10 ENCOUNTER — Encounter: Payer: Self-pay | Admitting: Nurse Practitioner

## 2024-06-10 ENCOUNTER — Other Ambulatory Visit: Payer: Self-pay | Admitting: Nurse Practitioner

## 2024-06-10 ENCOUNTER — Ambulatory Visit (INDEPENDENT_AMBULATORY_CARE_PROVIDER_SITE_OTHER): Admitting: Nurse Practitioner

## 2024-06-10 VITALS — BP 135/85 | HR 64 | Temp 98.2°F | Resp 16 | Ht 64.0 in | Wt 169.0 lb

## 2024-06-10 DIAGNOSIS — R7303 Prediabetes: Secondary | ICD-10-CM | POA: Insufficient documentation

## 2024-06-10 DIAGNOSIS — I10 Essential (primary) hypertension: Secondary | ICD-10-CM

## 2024-06-10 DIAGNOSIS — E663 Overweight: Secondary | ICD-10-CM

## 2024-06-10 DIAGNOSIS — E782 Mixed hyperlipidemia: Secondary | ICD-10-CM

## 2024-06-10 DIAGNOSIS — N289 Disorder of kidney and ureter, unspecified: Secondary | ICD-10-CM | POA: Diagnosis not present

## 2024-06-10 DIAGNOSIS — B351 Tinea unguium: Secondary | ICD-10-CM

## 2024-06-10 DIAGNOSIS — Z6829 Body mass index (BMI) 29.0-29.9, adult: Secondary | ICD-10-CM

## 2024-06-10 DIAGNOSIS — Z0001 Encounter for general adult medical examination with abnormal findings: Secondary | ICD-10-CM | POA: Diagnosis not present

## 2024-06-10 MED ORDER — WEGOVY 0.5 MG/0.5ML ~~LOC~~ SOAJ: 0.5000 mg | SUBCUTANEOUS | 5 refills | Status: DC

## 2024-06-10 MED ORDER — TERBINAFINE HCL 250 MG PO TABS: 250.0000 mg | ORAL_TABLET | Freq: Every day | ORAL | 0 refills | Status: AC

## 2024-06-10 NOTE — Progress Notes (Signed)
 [facility] [address] [place], [place] [address]  Internal MEDICINE  Office Visit Note  Patient Name: Amy Bullock  [id number]  [id number]  Date of Service: 06/10/2024  Chief Complaint  Patient presents with   Hypertension   Annual Exam    HPI Hallie presents for an annual well visit and physical exam.  Well-appearing 53 y.o. female with prediabetes, high cholesterol, abnormal kidney function Routine CRC screening: due in 2030 Routine mammogram: due in February next year  DEXA scan: not due yet  Labs: lab results reviewed with patient A1c is elevated at 6.4, glucose fasting is 100 LDL is 137, total cholesterol is high eGFR is slightly low and creatinine is elevated. No previous abnormal kidney function.  CBC is normal Liver function is normal.  New or worsening pain: none  Other concerns: discolored right thumb nail and discolored left great toenail.  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     Current Medication: Outpatient Encounter Medications as of 06/10/2024  Medication Sig   semaglutide -weight management (WEGOVY ) 0.5 MG/0.5ML SOAJ SQ injection Inject 0.5 mg into the skin once a week.   terbinafine  (LAMISIL ) 250 MG tablet Take 1 tablet (250 mg total) by mouth daily.   losartan -hydrochlorothiazide (HYZAAR) 100-25 MG tablet Take 1 tablet by mouth daily.   No facility-administered encounter medications on file as of 06/10/2024.    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   COLONOSCOPY WITH PROPOFOL  N/A 07/03/2022   Procedure: COLONOSCOPY WITH PROPOFOL ;  Surgeon: Jinny Carmine, MD;  Location: [facility];  Service: Endoscopy;  Laterality: N/A;   HALLUX VALGUS AUSTIN Right 06/11/2015   Procedure: HALLUX VALGUS AUSTIN;  Surgeon: Krystal Rosella, MD;  Location: [facility];  Service: Podiatry;  Laterality: Right;   POLYPECTOMY N/A  07/03/2022   Procedure: POLYPECTOMY;  Surgeon: Jinny Carmine, MD;  Location: [facility];  Service: Endoscopy;  Laterality: N/A;    Medical History: Past Medical History:  Diagnosis Date   Hypertension     Family History: History reviewed. No pertinent family history.  Social History   Socioeconomic History   Marital status: Single    Spouse name: Not on file   Number of children: Not on file   Years of education: Not on file   Highest education level: Not on file  Occupational History   Not on file  Tobacco Use   Smoking status: Never   Smokeless tobacco: Never  Vaping Use   Vaping status: Never Used  Substance and Sexual Activity   Alcohol use: No   Drug use: No   Sexual activity: Not Currently  Other Topics Concern   Not on file  Social History Narrative   Not on file   Social Drivers of Health   Financial Resource Strain: Not on file  Food Insecurity: Not on file  Transportation Needs: Not on file  Physical Activity: Not on file  Stress: Not on file  Social Connections: Not on file  Intimate Partner Violence: Not on file      Review of Systems  Constitutional:  Negative for activity change, appetite change, chills, fatigue, fever and unexpected weight change.  HENT: Negative.  Negative for congestion, ear pain, rhinorrhea, sore throat and trouble swallowing.   Eyes: Negative.   Respiratory: Negative.  Negative for cough, chest tightness, shortness of breath and wheezing.   Cardiovascular: Negative.  Negative  for chest pain and palpitations.  Gastrointestinal: Negative.  Negative for abdominal pain, blood in stool, constipation, diarrhea, nausea and vomiting.  Endocrine: Negative.   Genitourinary: Negative.  Negative for difficulty urinating, dysuria, frequency, hematuria and urgency.  Musculoskeletal: Negative.  Negative for arthralgias, back pain, joint swelling, myalgias and neck pain.  Skin: Negative.  Negative for rash and wound.   Allergic/Immunologic: Negative.  Negative for immunocompromised state.  Neurological: Negative.  Negative for dizziness, seizures, numbness and headaches.  Hematological: Negative.   Psychiatric/Behavioral: Negative.  Negative for behavioral problems, self-injury and suicidal ideas. The patient is not nervous/anxious.     Vital Signs: BP 135/85   Pulse 64   Temp 98.2 F (36.8 C)   Resp 16   Ht 5' 4 (1.626 m)   Wt 169 lb (76.7 kg)   SpO2 97%   BMI 29.01 kg/m    Physical Exam Vitals reviewed.  Constitutional:      General: She is not in acute distress.    Appearance: Normal appearance. She is not ill-appearing.  HENT:     Head: Normocephalic and atraumatic.     Right Ear: Tympanic membrane, ear canal and external ear normal.     Left Ear: Tympanic membrane, ear canal and external ear normal.     Nose: Nose normal. No congestion or rhinorrhea.     Mouth/Throat:     Mouth: Mucous membranes are moist.     Pharynx: Oropharynx is clear. No oropharyngeal exudate or posterior oropharyngeal erythema.  Eyes:     Extraocular Movements: Extraocular movements intact.     Conjunctiva/sclera: Conjunctivae normal.     Pupils: Pupils are equal, round, and reactive to light.  Cardiovascular:     Rate and Rhythm: Normal rate and regular rhythm.     Pulses: Normal pulses.     Heart sounds: Normal heart sounds. No murmur heard. Pulmonary:     Effort: Pulmonary effort is normal. No respiratory distress.     Breath sounds: Normal breath sounds. No wheezing.  Musculoskeletal:        General: Normal range of motion.     Cervical back: Normal range of motion and neck supple.     Right lower leg: No edema.     Left lower leg: No edema.  Lymphadenopathy:     Cervical: No cervical adenopathy.  Skin:    General: Skin is warm and dry.     Capillary Refill: Capillary refill takes less than 2 seconds.  Neurological:     Mental Status: She is alert and oriented to person, place, and time.      Cranial Nerves: No cranial nerve deficit.     Coordination: Coordination normal.     Gait: Gait normal.  Psychiatric:        Mood and Affect: Mood normal.        Behavior: Behavior normal.        Assessment/Plan: 1. Encounter for routine adult health examination with abnormal findings (Primary) Age-appropriate preventive screenings and vaccinations discussed, annual physical exam completed. Routine labs for health maintenance results discussed with the patient today.  PHM updated.    2. Prediabetes A1c is elevated at 6.4, discussed diet modifications to help lower glucose levels.   3. Abnormal kidney function Repeat kidney function in a few months  - CMP14+EGFR  4. Primary hypertension Continue BP medication as prescribed.   5. Mixed hyperlipidemia Discussed appropriate diet modifications and adding OTC fish oil or flaxseed oil supplement.   6. Onychomycosis  of left great toe Take terbinafine  as prescribed.  - terbinafine  (LAMISIL ) 250 MG tablet; Take 1 tablet (250 mg total) by mouth daily.  Dispense: 90 tablet; Refill: 0  7. Onychomycosis of nail of digit of hand Take terbinafine  as prescribed.  - terbinafine  (LAMISIL ) 250 MG tablet; Take 1 tablet (250 mg total) by mouth daily.  Dispense: 90 tablet; Refill: 0  8. Overweight with body mass index (BMI) of 29 to 29.9 in adult Will try to see if wegovy  is covered by her insurance. If not, will change to phentermine  - semaglutide -weight management (WEGOVY ) 0.5 MG/0.5ML SOAJ SQ injection; Inject 0.5 mg into the skin once a week.  Dispense: 2 mL; Refill: 5      General Counseling: isella slatten understanding of the findings of todays visit and agrees with plan of treatment. I have discussed any further diagnostic evaluation that may be needed or ordered today. We also reviewed her medications today. she has been encouraged to call the office with any questions or concerns that should arise related to todays  visit.    Orders Placed This Encounter  Procedures   CMP14+EGFR    Meds ordered this encounter  Medications   semaglutide -weight management (WEGOVY ) 0.5 MG/0.5ML SOAJ SQ injection    Sig: Inject 0.5 mg into the skin once a week.    Dispense:  2 mL    Refill:  5    Fill new script today, please send prior auth request if required.   terbinafine  (LAMISIL ) 250 MG tablet    Sig: Take 1 tablet (250 mg total) by mouth daily.    Dispense:  90 tablet    Refill:  0    Return in about 11 weeks (around 08/26/2024) for F/U, Leanore Biggers PCP.   Total time spent:30 Minutes Time spent includes review of chart, medications, test results, and follow up plan with the patient.   [place] Controlled Substance Database was reviewed by me.  This patient was seen by Mardy Maxin, FNP-C in collaboration with Dr. Sigrid Bathe as a part of collaborative care agreement.  Ysabella Babiarz R. Maxin, MSN, FNP-C Internal medicine

## 2024-06-16 ENCOUNTER — Telehealth: Payer: Self-pay

## 2024-06-16 NOTE — Telephone Encounter (Signed)
 Completed P.A. for patient's initiation of Wegovy .

## 2024-06-17 ENCOUNTER — Telehealth: Payer: Self-pay | Admitting: Nurse Practitioner

## 2024-06-17 ENCOUNTER — Telehealth: Payer: Self-pay

## 2024-06-17 NOTE — Telephone Encounter (Signed)
 Patient called inquiring about her wegovy . I explained rx was sent to [facility] on [address]. She then stated she has not heard from her pharmacy. Instructed to to call them to make sure they received-Toni

## 2024-06-18 ENCOUNTER — Telehealth: Payer: Self-pay

## 2024-06-18 NOTE — Telephone Encounter (Signed)
 Patient notified

## 2024-06-19 ENCOUNTER — Telehealth: Payer: Self-pay

## 2024-06-19 DIAGNOSIS — Z6829 Body mass index (BMI) 29.0-29.9, adult: Secondary | ICD-10-CM

## 2024-06-19 NOTE — Telephone Encounter (Signed)
 Patient's insurance does not cover any weight loss injections. AA to send Phentermine .

## 2024-06-19 NOTE — Telephone Encounter (Signed)
Completed P.A. for patient's Zepbound. 

## 2024-06-19 NOTE — Telephone Encounter (Signed)
 Done

## 2024-06-20 ENCOUNTER — Telehealth: Payer: Self-pay

## 2024-06-20 MED ORDER — PHENTERMINE HCL 37.5 MG PO TABS: 37.5000 mg | ORAL_TABLET | Freq: Every day | ORAL | 1 refills | Status: DC

## 2024-06-20 NOTE — Telephone Encounter (Signed)
 Spoke with patient. AA sent Phentermine , assisted patient in finding GoodRx coupon to bring cost down to $24.

## 2024-06-24 NOTE — Telephone Encounter (Signed)
 Done

## 2024-07-30 ENCOUNTER — Ambulatory Visit: Payer: Self-pay | Admitting: Nurse Practitioner

## 2024-07-30 LAB — CMP14+EGFR
ALT: 25 IU/L (ref 0–32)
AST: 22 IU/L (ref 0–40)
Albumin: 4.5 g/dL (ref 3.8–4.9)
Alkaline Phosphatase: 79 IU/L (ref 49–135)
BUN/Creatinine Ratio: 15 (ref 9–23)
BUN: 19 mg/dL (ref 6–24)
Bilirubin Total: 0.5 mg/dL (ref 0.0–1.2)
CO2: 21 mmol/L (ref 20–29)
Calcium: 10.1 mg/dL (ref 8.7–10.2)
Chloride: 104 mmol/L (ref 96–106)
Creatinine, Ser: 1.29 mg/dL — ABNORMAL HIGH (ref 0.57–1.00)
Globulin, Total: 3.2 g/dL (ref 1.5–4.5)
Glucose: 111 mg/dL — ABNORMAL HIGH (ref 70–99)
Potassium: 3.1 mmol/L — ABNORMAL LOW (ref 3.5–5.2)
Sodium: 143 mmol/L (ref 134–144)
Total Protein: 7.7 g/dL (ref 6.0–8.5)
eGFR: 49 mL/min/1.73 — ABNORMAL LOW (ref >59)

## 2024-07-30 NOTE — Progress Notes (Signed)
 We will discuss the lab results at her upcoming visit.

## 2024-08-26 ENCOUNTER — Encounter: Payer: Self-pay | Admitting: Nurse Practitioner

## 2024-08-26 ENCOUNTER — Ambulatory Visit: Admitting: Nurse Practitioner

## 2024-08-26 VITALS — BP 136/72 | HR 66 | Temp 96.7°F | Resp 16 | Ht 64.0 in | Wt 166.0 lb

## 2024-08-26 DIAGNOSIS — E876 Hypokalemia: Secondary | ICD-10-CM

## 2024-08-26 DIAGNOSIS — I10 Essential (primary) hypertension: Secondary | ICD-10-CM

## 2024-08-26 DIAGNOSIS — N1831 Chronic kidney disease, stage 3a: Secondary | ICD-10-CM | POA: Diagnosis not present

## 2024-08-26 DIAGNOSIS — Z6828 Body mass index (BMI) 28.0-28.9, adult: Secondary | ICD-10-CM

## 2024-08-26 DIAGNOSIS — E663 Overweight: Secondary | ICD-10-CM

## 2024-08-26 DIAGNOSIS — R7303 Prediabetes: Secondary | ICD-10-CM

## 2024-08-26 MED ORDER — DAPAGLIFLOZIN PROPANEDIOL 10 MG PO TABS: 10.0000 mg | ORAL_TABLET | Freq: Every day | ORAL | 4 refills | Status: AC

## 2024-08-26 NOTE — Progress Notes (Unsigned)
 [facility] [address] [place], [place] [address]  Internal MEDICINE  Office Visit Note  Patient Name: Amy Bullock  [id number]  [id number]  Date of Service: 08/26/2024  Chief Complaint  Patient presents with   Hypertension   Follow-up    Hypertension Pertinent negatives include no chest pain, neck pain, palpitations or shortness of breath.   Kara presents for a follow-up visit for prediabetes, low vitamin D, lab results and weight loss.  Weight loss -- did not tolerate phentermine  and her insurance will not cover wegovy  or zepbound.  Prediabetes -- last A1c was 6.4 in August. Need to repeat today. A1c is 6.1 today.  CKD stage 3a -- egfr 49, creatinine 1.29. candidate for SGLTG2i Hypokalemia -- low potassium 3.1 Stopped taking phentermine  because it was causing her to stay up at night.    Current Medication: Outpatient Encounter Medications as of 08/26/2024  Medication Sig   dapagliflozin propanediol (FARXIGA) 10 MG TABS tablet Take 1 tablet (10 mg total) by mouth daily before breakfast.   losartan -hydrochlorothiazide (HYZAAR) 100-25 MG tablet Take 1 tablet by mouth daily.   terbinafine  (LAMISIL ) 250 MG tablet Take 1 tablet (250 mg total) by mouth daily.   [DISCONTINUED] phentermine  (ADIPEX-P ) 37.5 MG tablet Take 1 tablet (37.5 mg total) by mouth daily before breakfast. May start with a half tablet for the first week and then increase to the full tablet daily. (Patient not taking: Reported on 08/26/2024)   No facility-administered encounter medications on file as of 08/26/2024.    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   COLONOSCOPY WITH PROPOFOL  N/A 07/03/2022   Procedure: COLONOSCOPY WITH PROPOFOL ;  Surgeon: Jinny Carmine, MD;  Location: [facility];  Service: Endoscopy;  Laterality: N/A;   HALLUX VALGUS AUSTIN Right 06/11/2015    Procedure: HALLUX VALGUS AUSTIN;  Surgeon: Krystal Rosella, MD;  Location: [facility];  Service: Podiatry;  Laterality: Right;   POLYPECTOMY N/A 07/03/2022   Procedure: POLYPECTOMY;  Surgeon: Jinny Carmine, MD;  Location: [facility];  Service: Endoscopy;  Laterality: N/A;    Medical History: Past Medical History:  Diagnosis Date   Hypertension     Family History: History reviewed. No pertinent family history.  Social History   Socioeconomic History   Marital status: Single    Spouse name: Not on file   Number of children: Not on file   Years of education: Not on file   Highest education level: Not on file  Occupational History   Not on file  Tobacco Use   Smoking status: Never   Smokeless tobacco: Never  Vaping Use   Vaping status: Never Used  Substance and Sexual Activity   Alcohol use: No   Drug use: No   Sexual activity: Not Currently  Other Topics Concern   Not on file  Social History Narrative   Not on file   Social Drivers of Health   Financial Resource Strain: Not on file  Food Insecurity: Not on file  Transportation Needs: Not on file  Physical Activity: Not on file  Stress: Not on file  Social Connections: Not on file  Intimate Partner Violence: Not on file      Review of Systems  Constitutional:  Negative for chills, fatigue and unexpected weight change.  HENT:  Negative for congestion, postnasal drip, rhinorrhea, sneezing and sore throat.   Eyes:  Negative  for redness.  Respiratory: Negative.  Negative for cough, chest tightness, shortness of breath and wheezing.   Cardiovascular: Negative.  Negative for chest pain and palpitations.  Gastrointestinal:  Negative for abdominal pain, constipation, diarrhea, nausea and vomiting.  Genitourinary:  Negative for dysuria and frequency.  Musculoskeletal:  Negative for arthralgias, back pain, joint swelling and neck pain.  Skin:  Negative for rash.  Neurological: Negative.  Negative for tremors and  numbness.  Hematological:  Negative for adenopathy. Does not bruise/bleed easily.  Psychiatric/Behavioral:  Negative for behavioral problems (Depression), sleep disturbance and suicidal ideas. The patient is not nervous/anxious.     Vital Signs: BP 136/72 Comment: 144/96  Pulse 66   Temp (!) 96.7 F (35.9 C)   Resp 16   Ht 5' 4 (1.626 m)   Wt 166 lb (75.3 kg)   SpO2 96%   BMI 28.49 kg/m    Physical Exam Vitals reviewed.  Constitutional:      General: She is not in acute distress.    Appearance: Normal appearance. She is not ill-appearing.  HENT:     Head: Normocephalic and atraumatic.  Eyes:     Pupils: Pupils are equal, round, and reactive to light.  Cardiovascular:     Rate and Rhythm: Normal rate and regular rhythm.  Pulmonary:     Effort: Pulmonary effort is normal. No respiratory distress.  Neurological:     Mental Status: She is alert and oriented to person, place, and time.  Psychiatric:        Mood and Affect: Mood normal.        Behavior: Behavior normal.        Assessment/Plan: 1. CKD stage 3a, GFR 45-59 ml/min (HCC) (Primary) *** - dapagliflozin propanediol (FARXIGA) 10 MG TABS tablet; Take 1 tablet (10 mg total) by mouth daily before breakfast.  Dispense: 30 tablet; Refill: 4  2. Primary hypertension ***  3. Prediabetes *** - POCT glycosylated hemoglobin (Hb A1C)  4. Hypokalemia ***  5. Overweight with body mass index (BMI) of 28 to 28.9 in adult ***   General Counseling: cherrill scrima understanding of the findings of todays visit and agrees with plan of treatment. I have discussed any further diagnostic evaluation that may be needed or ordered today. We also reviewed her medications today. she has been encouraged to call the office with any questions or concerns that should arise related to todays visit.    Orders Placed This Encounter  Procedures   POCT glycosylated hemoglobin (Hb A1C)    Meds ordered this encounter   Medications   dapagliflozin propanediol (FARXIGA) 10 MG TABS tablet    Sig: Take 1 tablet (10 mg total) by mouth daily before breakfast.    Dispense:  30 tablet    Refill:  4    Dx code N18.31    Return in about 1 month (around 09/26/2024) for F/U, eval new med, Josslyn Ciolek PCP.   Total time spent:30 Minutes Time spent includes review of chart, medications, test results, and follow up plan with the patient.   [place] Controlled Substance Database was reviewed by me.  This patient was seen by Mardy Maxin, FNP-C in collaboration with Dr. Sigrid Bathe as a part of collaborative care agreement.   Toluwanimi Radebaugh R. Maxin, MSN, FNP-C Internal medicine

## 2024-08-27 ENCOUNTER — Encounter: Payer: Self-pay | Admitting: Nurse Practitioner

## 2024-08-27 LAB — POCT GLYCOSYLATED HEMOGLOBIN (HGB A1C): Hemoglobin A1C: 6.1 % — AB (ref 4.0–5.6)

## 2024-09-12 ENCOUNTER — Telehealth: Payer: Self-pay

## 2024-09-12 MED ORDER — BENZONATATE 200 MG PO CAPS: 200.0000 mg | ORAL_CAPSULE | Freq: Three times a day (TID) | ORAL | 0 refills | Status: DC | PRN

## 2024-09-12 NOTE — Telephone Encounter (Signed)
 Patient notified

## 2024-09-15 ENCOUNTER — Telehealth: Payer: Self-pay | Admitting: Nurse Practitioner

## 2024-09-15 MED ORDER — AZITHROMYCIN 250 MG PO TABS: ORAL_TABLET | ORAL | 0 refills | Status: AC

## 2024-09-15 MED ORDER — HYDROCOD POLI-CHLORPHE POLI ER 10-8 MG/5ML PO SUER: 5.0000 mL | Freq: Two times a day (BID) | ORAL | 0 refills | Status: AC | PRN

## 2024-09-15 NOTE — Telephone Encounter (Signed)
 Pt advised we sent med if not feeling better need appt or go to urgent care

## 2024-09-29 ENCOUNTER — Ambulatory Visit: Admitting: Nurse Practitioner

## 2024-09-29 ENCOUNTER — Encounter: Payer: Self-pay | Admitting: Nurse Practitioner

## 2024-09-29 VITALS — BP 132/80 | HR 67 | Temp 97.2°F | Resp 16 | Ht 64.0 in | Wt 161.8 lb

## 2024-09-29 DIAGNOSIS — R7303 Prediabetes: Secondary | ICD-10-CM | POA: Diagnosis not present

## 2024-09-29 DIAGNOSIS — I10 Essential (primary) hypertension: Secondary | ICD-10-CM | POA: Diagnosis not present

## 2024-09-29 DIAGNOSIS — N1831 Chronic kidney disease, stage 3a: Secondary | ICD-10-CM

## 2024-09-29 DIAGNOSIS — E876 Hypokalemia: Secondary | ICD-10-CM

## 2024-09-29 NOTE — Progress Notes (Signed)
 [facility] [address] [place], [place] [address]  Internal MEDICINE  Office Visit Note  Patient Name: Amy Bullock  [id number]  [id number]  Date of Service: 09/29/2024  Chief Complaint  Patient presents with   Hypertension   Follow-up    Eval new med    HPI Myra presents for a follow-up visit for CKD stage 3a, hypokalemia, high cholesterol and weight loss.  Started on farxiga  last month due to CKD stage 3a and prediabetes. Did not tolerate phentermine  for weight loss and her insurance will not cover wegovy  or zepbound. Hypokalemia -- last potassium level was 3.1  High cholesterol --  not currently on statin therapy. Weight loss will help improve cholesterol Weight loss -- lost 5 lbs since her appt last month     Current Medication: Outpatient Encounter Medications as of 09/29/2024  Medication Sig   dapagliflozin  propanediol (FARXIGA ) 10 MG TABS tablet Take 1 tablet (10 mg total) by mouth daily before breakfast.   losartan -hydrochlorothiazide (HYZAAR) 100-25 MG tablet Take 1 tablet by mouth daily.   chlorpheniramine-HYDROcodone  (TUSSIONEX) 10-8 MG/5ML Take 5 mLs by mouth every 12 (twelve) hours as needed for cough.   [DISCONTINUED] benzonatate  (TESSALON ) 200 MG capsule Take 1 capsule (200 mg total) by mouth 3 (three) times daily as needed for cough. (Patient not taking: Reported on 09/29/2024)   No facility-administered encounter medications on file as of 09/29/2024.    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   COLONOSCOPY WITH PROPOFOL  N/A 07/03/2022   Procedure: COLONOSCOPY WITH PROPOFOL ;  Surgeon: Jinny Carmine, MD;  Location: [facility];  Service: Endoscopy;  Laterality: N/A;   HALLUX VALGUS AUSTIN Right 06/11/2015   Procedure: HALLUX VALGUS AUSTIN;  Surgeon: Krystal Rosella, MD;  Location: [facility];  Service: Podiatry;  Laterality: Right;    POLYPECTOMY N/A 07/03/2022   Procedure: POLYPECTOMY;  Surgeon: Jinny Carmine, MD;  Location: [facility];  Service: Endoscopy;  Laterality: N/A;    Medical History: Past Medical History:  Diagnosis Date   Hypertension     Family History: History reviewed. No pertinent family history.  Social History   Socioeconomic History   Marital status: Single    Spouse name: Not on file   Number of children: Not on file   Years of education: Not on file   Highest education level: Not on file  Occupational History   Not on file  Tobacco Use   Smoking status: Never   Smokeless tobacco: Never  Vaping Use   Vaping status: Never Used  Substance and Sexual Activity   Alcohol use: No   Drug use: No   Sexual activity: Not Currently  Other Topics Concern   Not on file  Social History Narrative   Not on file   Social Drivers of Health   Tobacco Use: Low Risk (09/29/2024)   Patient History    Smoking Tobacco Use: Never    Smokeless Tobacco Use: Never    Passive Exposure: Not on file  Financial Resource Strain: Not on file  Food Insecurity: Not on file  Transportation Needs: Not on file  Physical Activity: Not on file  Stress: Not on file  Social Connections: Not on file  Intimate Partner Violence: Not on file  Depression (PHQ2-9): Low Risk (06/10/2024)   Depression (PHQ2-9)    PHQ-2 Score: 0  Alcohol Screen: Not on file  Housing:  Not on file  Utilities: Not on file  Health Literacy: Not on file      Review of Systems  Constitutional:  Negative for chills, fatigue and unexpected weight change.  HENT:  Negative for congestion, postnasal drip, rhinorrhea, sneezing and sore throat.   Eyes:  Negative for redness.  Respiratory: Negative.  Negative for cough, chest tightness, shortness of breath and wheezing.   Cardiovascular: Negative.  Negative for chest pain and palpitations.  Gastrointestinal:  Negative for abdominal pain, constipation, diarrhea, nausea and vomiting.   Genitourinary:  Negative for dysuria and frequency.  Musculoskeletal:  Negative for arthralgias, back pain, joint swelling and neck pain.  Skin:  Negative for rash.  Neurological: Negative.  Negative for tremors and numbness.  Hematological:  Negative for adenopathy. Does not bruise/bleed easily.  Psychiatric/Behavioral:  Negative for behavioral problems (Depression), sleep disturbance and suicidal ideas. The patient is not nervous/anxious.     Vital Signs: BP 132/80   Pulse 67   Temp (!) 97.2 F (36.2 C)   Resp 16   Ht 5' 4 (1.626 m)   Wt 161 lb 12.8 oz (73.4 kg)   SpO2 98%   BMI 27.77 kg/m    Physical Exam Vitals reviewed.  Constitutional:      General: She is not in acute distress.    Appearance: Normal appearance. She is not ill-appearing.  HENT:     Head: Normocephalic and atraumatic.  Eyes:     Pupils: Pupils are equal, round, and reactive to light.  Cardiovascular:     Rate and Rhythm: Normal rate and regular rhythm.  Pulmonary:     Effort: Pulmonary effort is normal. No respiratory distress.  Neurological:     Mental Status: She is alert and oriented to person, place, and time.  Psychiatric:        Mood and Affect: Mood normal.        Behavior: Behavior normal.        Assessment/Plan: 1. CKD stage 3a, GFR 45-59 ml/min (HCC) (Primary) Repeat lab ordered. Continue farxiga  as prescribed. PAP application completed.  - Basic Metabolic Panel (BMET)  2. Hypokalemia Repeat lab ordered  - Basic Metabolic Panel (BMET)  3. Prediabetes Stable, continue farxiga  as prescribed. PAP application completed   4. Primary hypertension Stable, continue losartan -hydrochlorothiazide as prescribed    General Counseling: zaylia riolo understanding of the findings of todays visit and agrees with plan of treatment. I have discussed any further diagnostic evaluation that may be needed or ordered today. We also reviewed her medications today. she has been encouraged  to call the office with any questions or concerns that should arise related to todays visit.    Orders Placed This Encounter  Procedures   Basic Metabolic Panel (BMET)    No orders of the defined types were placed in this encounter.   Return in about 6 months (around 03/29/2025) for F/U, Kyren Knick PCP, Recheck A1C.   Total time spent:30 Minutes Time spent includes review of chart, medications, test results, and follow up plan with the patient.   [place] Controlled Substance Database was reviewed by me.  This patient was seen by Mardy Maxin, FNP-C in collaboration with Dr. Sigrid Bathe as a part of collaborative care agreement.   Eris Breck R. Maxin, MSN, FNP-C Internal medicine

## 2024-10-23 ENCOUNTER — Ambulatory Visit (INDEPENDENT_AMBULATORY_CARE_PROVIDER_SITE_OTHER): Payer: PRIVATE HEALTH INSURANCE | Admitting: Nurse Practitioner

## 2024-10-23 ENCOUNTER — Encounter: Payer: Self-pay | Admitting: Nurse Practitioner

## 2024-10-23 VITALS — BP 138/86 | HR 78 | Temp 97.2°F | Resp 16 | Ht 64.0 in | Wt 162.0 lb

## 2024-10-23 DIAGNOSIS — J028 Acute pharyngitis due to other specified organisms: Secondary | ICD-10-CM

## 2024-10-23 DIAGNOSIS — J029 Acute pharyngitis, unspecified: Secondary | ICD-10-CM

## 2024-10-23 DIAGNOSIS — B9689 Other specified bacterial agents as the cause of diseases classified elsewhere: Secondary | ICD-10-CM | POA: Diagnosis not present

## 2024-10-23 MED ORDER — AMOXICILLIN-POT CLAVULANATE 875-125 MG PO TABS: 1.0000 | ORAL_TABLET | Freq: Two times a day (BID) | ORAL | 0 refills | Status: AC

## 2024-10-23 NOTE — Progress Notes (Signed)
 [facility] [address] [place], [place] [address]  Internal MEDICINE  Office Visit Note  Patient Name: Amy Bullock  [id number]  [id number]  Date of Service: 10/23/2024  Chief Complaint  Patient presents with   Acute Visit    Sore throat      HPI Kimika presents for an acute sick visit for sore throat --onset of sore throat was about 2 days ago.  Unable to do rapid strep test due to strong gag reflex.  Denies any other symptoms.      Current Medication:  Outpatient Encounter Medications as of 10/23/2024  Medication Sig   amoxicillin -clavulanate (AUGMENTIN ) 875-125 MG tablet Take 1 tablet by mouth 2 (two) times daily for 10 days. May take with food   chlorpheniramine-HYDROcodone  (TUSSIONEX) 10-8 MG/5ML Take 5 mLs by mouth every 12 (twelve) hours as needed for cough.   dapagliflozin  propanediol (FARXIGA ) 10 MG TABS tablet Take 1 tablet (10 mg total) by mouth daily before breakfast.   losartan -hydrochlorothiazide (HYZAAR) 100-25 MG tablet Take 1 tablet by mouth daily.   No facility-administered encounter medications on file as of 10/23/2024.      Medical History: Past Medical History:  Diagnosis Date   Hypertension      Vital Signs: BP 138/86   Pulse 78   Temp (!) 97.2 F (36.2 C)   Resp 16   Ht 5' 4 (1.626 m)   Wt 162 lb (73.5 kg)   SpO2 97%   BMI 27.81 kg/m    Review of Systems  Constitutional:  Negative for appetite change, chills, fatigue and fever.  HENT:  Positive for sore throat and trouble swallowing. Negative for congestion, ear pain, postnasal drip, rhinorrhea, sinus pressure, sinus pain and sneezing.   Respiratory: Negative.  Negative for cough, chest tightness, shortness of breath and wheezing.   Cardiovascular: Negative.  Negative for chest pain and palpitations.  Musculoskeletal: Negative.   Neurological:  Negative for headaches.    Physical Exam Vitals reviewed.  Constitutional:      General: She is not in acute  distress.    Appearance: Normal appearance. She is not ill-appearing.  HENT:     Head: Normocephalic and atraumatic.     Right Ear: Tympanic membrane, ear canal and external ear normal.     Left Ear: Tympanic membrane, ear canal and external ear normal.     Nose: Nose normal.     Mouth/Throat:     Pharynx: Posterior oropharyngeal erythema present.     Tonsils: Tonsillar exudate present. 2+ on the right. 2+ on the left.  Eyes:     Pupils: Pupils are equal, round, and reactive to light.  Pulmonary:     Effort: Pulmonary effort is normal. No respiratory distress.  Neurological:     Mental Status: She is alert and oriented to person, place, and time.  Psychiatric:        Mood and Affect: Mood normal.        Behavior: Behavior normal.       Assessment/Plan: 1. Acute bacterial pharyngitis (Primary) Augmentin  prescribed, take until gone  - amoxicillin -clavulanate (AUGMENTIN ) 875-125 MG tablet; Take 1 tablet by mouth 2 (two) times daily for 10 days. May take with food  Dispense: 20 tablet; Refill: 0  2. Sore throat Augmentin  prescribed, take until gone.  - amoxicillin -clavulanate (AUGMENTIN ) 875-125 MG tablet; Take 1 tablet by mouth 2 (two) times daily for 10 days. May take with food  Dispense: 20 tablet; Refill: 0   General Counseling:  Kinzleigh verbalizes understanding of the findings of todays visit and agrees with plan of treatment. I have discussed any further diagnostic evaluation that may be needed or ordered today. We also reviewed her medications today. she has been encouraged to call the office with any questions or concerns that should arise related to todays visit.    Counseling:    No orders of the defined types were placed in this encounter.   Meds ordered this encounter  Medications   amoxicillin -clavulanate (AUGMENTIN ) 875-125 MG tablet    Sig: Take 1 tablet by mouth 2 (two) times daily for 10 days. May take with food    Dispense:  20 tablet    Refill:  0     Fill new script today.    Return if symptoms worsen or fail to improve.  [place] Controlled Substance Database was reviewed by me for overdose risk score (ORS)  Time spent:20 Minutes Time spent with patient included reviewing progress notes, labs, imaging studies, and discussing plan for follow up.   This patient was seen by Mardy Maxin, FNP-C in collaboration with Dr. Sigrid Bathe as a part of collaborative care agreement.  Ellarose Brandi R. Maxin, MSN, FNP-C Internal Medicine

## 2024-10-24 ENCOUNTER — Telehealth: Payer: Self-pay | Admitting: Nurse Practitioner

## 2024-10-24 NOTE — Telephone Encounter (Signed)
 Work Note warden/ranger to patient-Amy Bullock

## 2025-03-30 ENCOUNTER — Ambulatory Visit: Payer: PRIVATE HEALTH INSURANCE | Admitting: Nurse Practitioner

## 2025-06-11 ENCOUNTER — Encounter: Payer: PRIVATE HEALTH INSURANCE | Admitting: Nurse Practitioner
